# Patient Record
Sex: Male | Born: 1935 | Race: White | Hispanic: No | Marital: Married | Smoking: Former smoker
Health system: Southern US, Community
[De-identification: ages and names within clinical notes are randomized; demographics above are authoritative.]

## PROBLEM LIST (undated history)

## (undated) DIAGNOSIS — I4891 Unspecified atrial fibrillation: Secondary | ICD-10-CM

## (undated) DIAGNOSIS — D649 Anemia, unspecified: Secondary | ICD-10-CM

## (undated) DIAGNOSIS — R42 Dizziness and giddiness: Secondary | ICD-10-CM

## (undated) DIAGNOSIS — K7689 Other specified diseases of liver: Secondary | ICD-10-CM

## (undated) DIAGNOSIS — R634 Abnormal weight loss: Secondary | ICD-10-CM

## (undated) DIAGNOSIS — N400 Enlarged prostate without lower urinary tract symptoms: Secondary | ICD-10-CM

## (undated) DIAGNOSIS — I700 Atherosclerosis of aorta: Secondary | ICD-10-CM

## (undated) DIAGNOSIS — Z860100 Personal history of colon polyps, unspecified: Secondary | ICD-10-CM

## (undated) DIAGNOSIS — I351 Nonrheumatic aortic (valve) insufficiency: Secondary | ICD-10-CM

## (undated) DIAGNOSIS — I071 Rheumatic tricuspid insufficiency: Secondary | ICD-10-CM

## (undated) DIAGNOSIS — I340 Nonrheumatic mitral (valve) insufficiency: Secondary | ICD-10-CM

## (undated) DIAGNOSIS — N139 Obstructive and reflux uropathy, unspecified: Secondary | ICD-10-CM

## (undated) DIAGNOSIS — Z87442 Personal history of urinary calculi: Secondary | ICD-10-CM

## (undated) DIAGNOSIS — K8020 Calculus of gallbladder without cholecystitis without obstruction: Secondary | ICD-10-CM

## (undated) DIAGNOSIS — K4090 Unilateral inguinal hernia, without obstruction or gangrene, not specified as recurrent: Secondary | ICD-10-CM

## (undated) DIAGNOSIS — I517 Cardiomegaly: Secondary | ICD-10-CM

## (undated) DIAGNOSIS — R110 Nausea: Secondary | ICD-10-CM

## (undated) DIAGNOSIS — Z8719 Personal history of other diseases of the digestive system: Secondary | ICD-10-CM

## (undated) DIAGNOSIS — Z87438 Personal history of other diseases of male genital organs: Secondary | ICD-10-CM

## (undated) DIAGNOSIS — Z9889 Other specified postprocedural states: Secondary | ICD-10-CM

## (undated) DIAGNOSIS — Z9289 Personal history of other medical treatment: Secondary | ICD-10-CM

## (undated) DIAGNOSIS — IMO0002 Reserved for concepts with insufficient information to code with codable children: Secondary | ICD-10-CM

## (undated) HISTORY — DX: Personal history of colonic polyps: Z86.010

## (undated) HISTORY — DX: Obstructive and reflux uropathy, unspecified: N13.9

## (undated) HISTORY — DX: Personal history of other diseases of the digestive system: Z87.19

## (undated) HISTORY — PX: TESTICLE SURGERY: SHX794

## (undated) HISTORY — DX: Unspecified atrial fibrillation: I48.91

## (undated) HISTORY — DX: Personal history of colon polyps, unspecified: Z86.0100

---

## 1972-01-22 HISTORY — PX: HERNIA REPAIR: SHX51

## 2016-10-21 DIAGNOSIS — I071 Rheumatic tricuspid insufficiency: Secondary | ICD-10-CM

## 2016-10-21 DIAGNOSIS — I351 Nonrheumatic aortic (valve) insufficiency: Secondary | ICD-10-CM

## 2016-10-21 DIAGNOSIS — N400 Enlarged prostate without lower urinary tract symptoms: Secondary | ICD-10-CM

## 2016-10-21 DIAGNOSIS — I340 Nonrheumatic mitral (valve) insufficiency: Secondary | ICD-10-CM

## 2016-10-21 HISTORY — DX: Rheumatic tricuspid insufficiency: I07.1

## 2016-10-21 HISTORY — DX: Benign prostatic hyperplasia without lower urinary tract symptoms: N40.0

## 2016-10-21 HISTORY — DX: Nonrheumatic aortic (valve) insufficiency: I35.1

## 2016-10-21 HISTORY — DX: Nonrheumatic mitral (valve) insufficiency: I34.0

## 2016-11-04 ENCOUNTER — Inpatient Hospital Stay (HOSPITAL_COMMUNITY)
Admission: EM | Admit: 2016-11-04 | Discharge: 2016-11-07 | DRG: 684 | Disposition: A | Discharge status: Home / Self Care | Payer: Medicare Other | Attending: Family Medicine | Admitting: Family Medicine

## 2016-11-04 ENCOUNTER — Encounter (HOSPITAL_COMMUNITY): Payer: Self-pay

## 2016-11-04 DIAGNOSIS — R030 Elevated blood-pressure reading, without diagnosis of hypertension: Secondary | ICD-10-CM | POA: Diagnosis present

## 2016-11-04 DIAGNOSIS — N400 Enlarged prostate without lower urinary tract symptoms: Secondary | ICD-10-CM | POA: Diagnosis present

## 2016-11-04 DIAGNOSIS — R81 Glycosuria: Secondary | ICD-10-CM | POA: Diagnosis present

## 2016-11-04 DIAGNOSIS — N179 Acute kidney failure, unspecified: Principal | ICD-10-CM | POA: Diagnosis present

## 2016-11-04 DIAGNOSIS — R339 Retention of urine, unspecified: Secondary | ICD-10-CM | POA: Diagnosis not present

## 2016-11-04 DIAGNOSIS — N132 Hydronephrosis with renal and ureteral calculous obstruction: Secondary | ICD-10-CM | POA: Diagnosis not present

## 2016-11-04 DIAGNOSIS — Z8249 Family history of ischemic heart disease and other diseases of the circulatory system: Secondary | ICD-10-CM

## 2016-11-04 DIAGNOSIS — N320 Bladder-neck obstruction: Secondary | ICD-10-CM | POA: Diagnosis present

## 2016-11-04 DIAGNOSIS — I4891 Unspecified atrial fibrillation: Secondary | ICD-10-CM | POA: Diagnosis not present

## 2016-11-04 DIAGNOSIS — N3000 Acute cystitis without hematuria: Secondary | ICD-10-CM | POA: Diagnosis not present

## 2016-11-04 DIAGNOSIS — N202 Calculus of kidney with calculus of ureter: Secondary | ICD-10-CM | POA: Diagnosis not present

## 2016-11-04 DIAGNOSIS — N10 Acute tubulo-interstitial nephritis: Secondary | ICD-10-CM

## 2016-11-04 DIAGNOSIS — R338 Other retention of urine: Secondary | ICD-10-CM | POA: Diagnosis not present

## 2016-11-04 DIAGNOSIS — R739 Hyperglycemia, unspecified: Secondary | ICD-10-CM | POA: Diagnosis not present

## 2016-11-04 DIAGNOSIS — N136 Pyonephrosis: Secondary | ICD-10-CM | POA: Diagnosis present

## 2016-11-04 DIAGNOSIS — R748 Abnormal levels of other serum enzymes: Secondary | ICD-10-CM | POA: Diagnosis present

## 2016-11-04 DIAGNOSIS — R350 Frequency of micturition: Secondary | ICD-10-CM | POA: Diagnosis not present

## 2016-11-04 DIAGNOSIS — R319 Hematuria, unspecified: Secondary | ICD-10-CM | POA: Diagnosis not present

## 2016-11-04 DIAGNOSIS — R34 Anuria and oliguria: Secondary | ICD-10-CM | POA: Diagnosis not present

## 2016-11-04 DIAGNOSIS — N139 Obstructive and reflux uropathy, unspecified: Secondary | ICD-10-CM

## 2016-11-04 DIAGNOSIS — N390 Urinary tract infection, site not specified: Secondary | ICD-10-CM | POA: Diagnosis not present

## 2016-11-04 LAB — URINALYSIS, ROUTINE W REFLEX MICROSCOPIC
Bacteria, UA: NONE SEEN
Bilirubin Urine: NEGATIVE
Glucose, UA: 50 mg/dL — AB
KETONES UR: NEGATIVE mg/dL
LEUKOCYTES UA: NEGATIVE
Nitrite: NEGATIVE
Protein, ur: 30 mg/dL — AB
Specific Gravity, Urine: 1.014 (ref 1.005–1.030)
pH: 5 (ref 5.0–8.0)

## 2016-11-04 LAB — COMPREHENSIVE METABOLIC PANEL
ALBUMIN: 4 g/dL (ref 3.5–5.0)
ALK PHOS: 57 U/L (ref 38–126)
ALT: 14 U/L — AB (ref 17–63)
AST: 24 U/L (ref 15–41)
Anion gap: 13 (ref 5–15)
BILIRUBIN TOTAL: 1 mg/dL (ref 0.3–1.2)
BUN: 39 mg/dL — AB (ref 6–20)
CALCIUM: 9.1 mg/dL (ref 8.9–10.3)
CO2: 22 mmol/L (ref 22–32)
CREATININE: 2.75 mg/dL — AB (ref 0.61–1.24)
Chloride: 101 mmol/L (ref 101–111)
GFR calc Af Amer: 23 mL/min — ABNORMAL LOW (ref >60)
GFR calc non Af Amer: 20 mL/min — ABNORMAL LOW (ref >60)
GLUCOSE: 129 mg/dL — AB (ref 65–99)
Potassium: 4.3 mmol/L (ref 3.5–5.1)
Sodium: 136 mmol/L (ref 135–145)
TOTAL PROTEIN: 7.3 g/dL (ref 6.5–8.1)

## 2016-11-04 LAB — CBC
HCT: 47.1 % (ref 39.0–52.0)
Hemoglobin: 16 g/dL (ref 13.0–17.0)
MCH: 31.6 pg (ref 26.0–34.0)
MCHC: 34 g/dL (ref 30.0–36.0)
MCV: 92.9 fL (ref 78.0–100.0)
Platelets: 230 10*3/uL (ref 150–400)
RBC: 5.07 MIL/uL (ref 4.22–5.81)
RDW: 13.5 % (ref 11.5–15.5)
WBC: 18.5 10*3/uL — ABNORMAL HIGH (ref 4.0–10.5)

## 2016-11-04 LAB — LIPASE, BLOOD: Lipase: 80 U/L — ABNORMAL HIGH (ref 11–51)

## 2016-11-04 MED ORDER — DEXTROSE 5 % IV SOLN
1.0000 g | Freq: Once | INTRAVENOUS | Status: AC
  Administered 2016-11-04: 1 g via INTRAVENOUS
  Filled 2016-11-04: qty 10

## 2016-11-04 NOTE — ED Provider Notes (Signed)
[facility] EMERGENCY DEPARTMENT Provider Note   CSN: [id number] Arrival date & time: 11/04/16  1630     History   Chief Complaint Chief Complaint  Patient presents with  . Hematuria    HPI Johnny Patel is a 83 y.o. male.  HPI Patient with no previous medical history. Presents with difficulty urinating for the past 4 days. Has had bilateral flank pain intermittently. Denies nausea or vomiting. Denies fever or chills. Takes Tylenol occasionally but takes no other medication. States he has not seen a doctor for the past 43 years. History reviewed. No pertinent past medical history.  Patient Active Problem List   Diagnosis Date Noted  . ARF (acute renal failure) ([place]) 11/04/2016    Past Surgical History:  Procedure Laterality Date  . [place] Medications    Prior to Admission medications   Not on File    Family History No family history on file.  Social History Social History  Substance Use Topics  . Smoking status: Never Smoker  . Smokeless tobacco: Never Used  . Alcohol use Yes     Allergies   Patient has no allergy information on record.   Review of Systems Review of Systems  Constitutional: Negative for chills and fever.  Respiratory: Negative for shortness of breath.   Cardiovascular: Negative for chest pain.  Gastrointestinal: Positive for abdominal distention and abdominal pain. Negative for nausea and vomiting.  Genitourinary: Positive for difficulty urinating and flank pain. Negative for dysuria, frequency and hematuria.  Musculoskeletal: Positive for back pain. Negative for neck pain and neck stiffness.  Skin: Negative for rash and wound.  Neurological: Negative for dizziness, weakness, numbness and headaches.  All other systems reviewed and are negative.    Physical Exam Updated Vital Signs BP (!) 149/109 (BP Location: Left Arm)   Pulse (!) 106   Temp 98.3 F (36.8 C) (Oral)   Resp 18   Ht 5\' 11"  (1.803 m)    Wt 90.3 kg (199 lb)   SpO2 100%   BMI 27.75 kg/m   Physical Exam  Constitutional: He is oriented to person, place, and time. He appears well-developed and well-nourished.  HENT:  Head: Normocephalic and atraumatic.  Mouth/Throat: Oropharynx is clear and moist.  Eyes: Pupils are equal, round, and reactive to light. EOM are normal.  Neck: Normal range of motion. Neck supple.  Cardiovascular: Normal rate and regular rhythm.   Pulmonary/Chest: Effort normal and breath sounds normal. No respiratory distress. He has no wheezes. He has no rales. He exhibits no tenderness.  Abdominal: Soft. Bowel sounds are normal. He exhibits mass. There is tenderness. There is no rebound and no guarding.  Firm past, tender mass in the supraumbilical region.  Genitourinary: Penis normal.  Musculoskeletal: Normal range of motion. He exhibits no edema or tenderness.  No CVA tenderness.  Neurological: He is alert and oriented to person, place, and time.  Skin: Skin is warm and dry. No rash noted. No erythema.  Psychiatric: He has a normal mood and affect. His behavior is normal.  Nursing note and vitals reviewed.    ED Treatments / Results  Labs (all labs ordered are listed, but only abnormal results are displayed) Labs Reviewed  URINALYSIS, ROUTINE W REFLEX MICROSCOPIC - Abnormal; Notable for the following:       Result Value   APPearance HAZY (*)    Glucose, UA 50 (*)    Hgb urine dipstick LARGE (*)  Protein, ur 30 (*)    Squamous Epithelial / LPF 0-5 (*)    All other components within normal limits  LIPASE, BLOOD - Abnormal; Notable for the following:    Lipase 80 (*)    All other components within normal limits  COMPREHENSIVE METABOLIC PANEL - Abnormal; Notable for the following:    Glucose, Bld 129 (*)    BUN 39 (*)    Creatinine, Ser 2.75 (*)    ALT 14 (*)    GFR calc non Af Amer 20 (*)    GFR calc Af Amer 23 (*)    All other components within normal limits  CBC - Abnormal; Notable  for the following:    WBC 18.5 (*)    All other components within normal limits  URINE CULTURE    EKG  EKG Interpretation None       Radiology No results found.  Procedures Procedures (including critical care time)  Medications Ordered in ED Medications  cefTRIAXone (ROCEPHIN) 1 g in dextrose 5 % 50 mL IVPB (1 g Intravenous New Bag/Given 11/04/16 2248)     Initial Impression / Assessment and Plan / ED Course  I have reviewed the triage vital signs and the nursing notes.  Pertinent labs & imaging results that were available during my care of the patient were reviewed by me and considered in my medical decision making (see chart for details).     Bladder scan with greater than 700 mL. Full catheter place. Abdominal mass has diminished in size. Patient says he is feeling much better. Labs with elevation in white blood cell count, elevation in creatinine and urine with too numerous to count red blood cells and white blood cells. Urine was sent for culture. Will treat for likely UTI. Discussed with Dr. Maudie Mercury. Will admit.  Final Clinical Impressions(s) / ED Diagnoses   Final diagnoses:  Urinary retention  AKI (acute kidney injury) [facility])    New Prescriptions New Prescriptions   No medications on file     Julianne Rice, MD 11/04/16 2253

## 2016-11-04 NOTE — ED Triage Notes (Signed)
Pt went to urgent care this morning and was checked for a UTI. Pt did not ever notice blood in his urine, but per Urgent Care paperwork, states there is a large amount of blood in urine. Pt is not lightheaded or dizzy. Pt states he is not able to pee easily unless he forces it out.

## 2016-11-05 ENCOUNTER — Inpatient Hospital Stay (HOSPITAL_COMMUNITY): Payer: Medicare Other

## 2016-11-05 ENCOUNTER — Encounter (HOSPITAL_COMMUNITY): Payer: Self-pay | Admitting: Internal Medicine

## 2016-11-05 DIAGNOSIS — R339 Retention of urine, unspecified: Secondary | ICD-10-CM | POA: Insufficient documentation

## 2016-11-05 DIAGNOSIS — R739 Hyperglycemia, unspecified: Secondary | ICD-10-CM

## 2016-11-05 DIAGNOSIS — I4891 Unspecified atrial fibrillation: Secondary | ICD-10-CM

## 2016-11-05 DIAGNOSIS — N179 Acute kidney failure, unspecified: Secondary | ICD-10-CM

## 2016-11-05 DIAGNOSIS — N3000 Acute cystitis without hematuria: Secondary | ICD-10-CM

## 2016-11-05 DIAGNOSIS — N139 Obstructive and reflux uropathy, unspecified: Secondary | ICD-10-CM

## 2016-11-05 DIAGNOSIS — N10 Acute tubulo-interstitial nephritis: Secondary | ICD-10-CM

## 2016-11-05 DIAGNOSIS — N390 Urinary tract infection, site not specified: Secondary | ICD-10-CM

## 2016-11-05 LAB — COMPREHENSIVE METABOLIC PANEL
ALBUMIN: 3.2 g/dL — AB (ref 3.5–5.0)
ALK PHOS: 44 U/L (ref 38–126)
ALT: 10 U/L — ABNORMAL LOW (ref 17–63)
ANION GAP: 11 (ref 5–15)
AST: 18 U/L (ref 15–41)
BUN: 38 mg/dL — ABNORMAL HIGH (ref 6–20)
CALCIUM: 8.5 mg/dL — AB (ref 8.9–10.3)
CO2: 22 mmol/L (ref 22–32)
Chloride: 105 mmol/L (ref 101–111)
Creatinine, Ser: 1.75 mg/dL — ABNORMAL HIGH (ref 0.61–1.24)
GFR calc non Af Amer: 34 mL/min — ABNORMAL LOW (ref >60)
GFR, EST AFRICAN AMERICAN: 40 mL/min — AB (ref >60)
GLUCOSE: 105 mg/dL — AB (ref 65–99)
POTASSIUM: 3.8 mmol/L (ref 3.5–5.1)
Sodium: 138 mmol/L (ref 135–145)
TOTAL PROTEIN: 6 g/dL — AB (ref 6.5–8.1)
Total Bilirubin: 0.9 mg/dL (ref 0.3–1.2)

## 2016-11-05 LAB — CBC
HEMATOCRIT: 42 % (ref 39.0–52.0)
HEMOGLOBIN: 14.3 g/dL (ref 13.0–17.0)
MCH: 31.5 pg (ref 26.0–34.0)
MCHC: 34 g/dL (ref 30.0–36.0)
MCV: 92.5 fL (ref 78.0–100.0)
Platelets: 203 10*3/uL (ref 150–400)
RBC: 4.54 MIL/uL (ref 4.22–5.81)
RDW: 13.5 % (ref 11.5–15.5)
WBC: 14.4 10*3/uL — ABNORMAL HIGH (ref 4.0–10.5)

## 2016-11-05 LAB — TSH: TSH: 0.588 u[IU]/mL (ref 0.350–4.500)

## 2016-11-05 LAB — HEMOGLOBIN A1C
Hgb A1c MFr Bld: 5.5 % (ref 4.8–5.6)
MEAN PLASMA GLUCOSE: 111.15 mg/dL

## 2016-11-05 LAB — T4, FREE: FREE T4: 1.09 ng/dL (ref 0.61–1.12)

## 2016-11-05 LAB — SODIUM, URINE, RANDOM: SODIUM UR: 38 mmol/L

## 2016-11-05 MED ORDER — ACETAMINOPHEN 650 MG RE SUPP: 650.0000 mg | Freq: Four times a day (QID) | RECTAL | Status: DC | PRN

## 2016-11-05 MED ORDER — ACETAMINOPHEN 325 MG PO TABS: 650.0000 mg | ORAL_TABLET | Freq: Four times a day (QID) | ORAL | Status: DC | PRN

## 2016-11-05 MED ORDER — SODIUM CHLORIDE 0.9 % IV SOLN
INTRAVENOUS | Status: AC
  Administered 2016-11-05 (×2): via INTRAVENOUS
  Administered 2016-11-05: 1000 mL via INTRAVENOUS

## 2016-11-05 MED ORDER — DEXTROSE 5 % IV SOLN
1.0000 g | INTRAVENOUS | Status: DC
  Administered 2016-11-06 – 2016-11-07 (×2): 1 g via INTRAVENOUS
  Filled 2016-11-05 (×3): qty 10

## 2016-11-05 MED ORDER — TAMSULOSIN HCL 0.4 MG PO CAPS
0.4000 mg | ORAL_CAPSULE | Freq: Every day | ORAL | Status: DC
  Administered 2016-11-05 – 2016-11-07 (×3): 0.4 mg via ORAL
  Filled 2016-11-05 (×3): qty 1

## 2016-11-05 NOTE — Progress Notes (Signed)
PROGRESS NOTE  Johnny Patel XKG:[id number] DOB: 11/10/1931 DOA: 11/04/2016 PCP: Patient, No Pcp Per  Brief History:  83 year old male with no documented chronic medical problems presented with dysuria and difficulty urinating since 11/01/2016.  The patient states that he has not seen a physician in 43 years; as a result, the patient takes no prescription medications. He states that he has had difficulty starting his urinary stream for some time. Since 11/01/2016, he has had to push on his abdomen to increase intra-abdominal pressure in order to urinate. He also noted some hematuria. Because of increasing left flank pain and suprapubic pain, he presented to emergency department for further evaluation. He denied any fevers, chills, chest pain, sob, nausea, vomiting.  In the emergency department, bladder scan revealed > 700 mL of urine. Foley catheter was placed releasing 1 L of urine. Subsequently, his abdominal pain and flank pain improved. Urinalysis showed TNTC WBC. The patient was started on IV fluids and intravenous ceftriaxone.  Assessment/Plan: AKI -Baseline renal function unknown -Presenting creatinine 2.75 -Renal ultrasound -Likely secondary to obstructive uropathy -A.m. BMP -keep Foley for today, voiding trial starting 11/06/2016 -Continue tamulosin -continue IVF  Atrial fibrillation, unspecified type -The patient has atrial fibrillation on telemetry with IRRR on examination -Obtain EKG -TSH -Echocardiogram  Pyelonephritis -pt presented with left flank pain and leukocytosis -continue ceftriaxone pending culture data  Elevated lipase -Likely secondary to acute kidney injury -No clinical evidence for pancreatitis  Hyperglycemia with glycosuria -Hemoglobin A1c  Elevated Blood Pressure  -no hx of HTN -may be due to acute medical situation -monitor BP for now    Disposition Plan:   Home in 1-2 days  Family Communication:  Family updated at bedside  10/16--Total time spent 31 minutes.  Greater than 50% spent face to face counseling and coordinating care. 0955 to 3   Consultants:  none  Code Status:  FULL  DVT Prophylaxis:  SCDs   Procedures: As Listed in Progress Note Above  Antibiotics: Ceftriaxone 10/15>>>    Subjective: Patient denies fevers, chills, headache, chest pain, dyspnea, nausea, vomiting, diarrhea, abdominal pain, dysuria, hematuria, hematochezia, and melena.   Objective: Vitals:   11/04/16 2330 11/05/16 0000 11/05/16 0602 11/05/16 0850  BP: (!) 142/98 (!) 143/98 (!) 140/91 128/89  Pulse: (!) 102 99 99 85  Resp: 18 17 14 18   Temp:   98.5 F (36.9 C)   TempSrc:   Oral   SpO2: 94% 94% 96% 98%  Weight:      Height:        Intake/Output Summary (Last 24 hours) at 11/05/16 1014 Last data filed at 11/04/16 2323  Gross per 24 hour  Intake               50 ml  Output             1100 ml  Net            -1050 ml   Weight change:  Exam:   General:  Pt is alert, follows commands appropriately, not in acute distress  HEENT: No icterus, No thrush, No neck mass, [place]/AT  Cardiovascular: RRR, S1/S2, no rubs, no gallops  Respiratory: CTA bilaterally, no wheezing, no crackles, no rhonchi  Abdomen: Soft/+BS, non tender, non distended, no guarding  Extremities: No edema, No lymphangitis, No petechiae, No rashes, no synovitis   Data Reviewed: I have personally reviewed following labs and imaging studies Basic Metabolic Panel:  Recent Labs  Lab 11/04/16 1917 11/05/16 0500  NA 136 138  K 4.3 3.8  CL 101 105  CO2 22 22  GLUCOSE 129* 105*  BUN 39* 38*  CREATININE 2.75* 1.75*  CALCIUM 9.1 8.5*   Liver Function Tests:  Recent Labs Lab 11/04/16 1917 11/05/16 0500  AST 24 18  ALT 14* 10*  ALKPHOS 57 44  BILITOT 1.0 0.9  PROT 7.3 6.0*  ALBUMIN 4.0 3.2*    Recent Labs Lab 11/04/16 1917  LIPASE 80*   No results for input(s): AMMONIA in the last 168 hours. Coagulation Profile: No  results for input(s): INR, PROTIME in the last 168 hours. CBC:  Recent Labs Lab 11/04/16 1917 11/05/16 0500  WBC 18.5* 14.4*  HGB 16.0 14.3  HCT 47.1 42.0  MCV 92.9 92.5  PLT 230 203   Cardiac Enzymes: No results for input(s): CKTOTAL, CKMB, CKMBINDEX, TROPONINI in the last 168 hours. BNP: Invalid input(s): POCBNP CBG: No results for input(s): GLUCAP in the last 168 hours. HbA1C: No results for input(s): HGBA1C in the last 72 hours. Urine analysis:    Component Value Date/Time   COLORURINE YELLOW 11/04/2016 1720   APPEARANCEUR HAZY (A) 11/04/2016 1720   LABSPEC 1.014 11/04/2016 1720   PHURINE 5.0 11/04/2016 1720   GLUCOSEU 50 (A) 11/04/2016 1720   HGBUR LARGE (A) 11/04/2016 1720   BILIRUBINUR NEGATIVE 11/04/2016 1720   KETONESUR NEGATIVE 11/04/2016 1720   PROTEINUR 30 (A) 11/04/2016 1720   NITRITE NEGATIVE 11/04/2016 1720   LEUKOCYTESUR NEGATIVE 11/04/2016 1720   Sepsis Labs: @LABRCNTIP (procalcitonin:4,lacticidven:4) )No results found for this or any previous visit (from the past 240 hour(s)).   Scheduled Meds: . tamsulosin  0.4 mg Oral QPC supper   Continuous Infusions: . sodium chloride 1,000 mL (11/05/16 0136)  . cefTRIAXone (ROCEPHIN)  IV      Procedures/Studies: US Renal  Result Date: 11/05/2016 CLINICAL DATA:  Acute renal failure EXAM: RENAL / URINARY TRACT ULTRASOUND COMPLETE COMPARISON:  None. FINDINGS: Right Kidney: Length: 12.0 cm. Moderate hydronephrosis is noted. A 2.1 cm cyst is noted centrally adjacent to the renal pelvis. Left Kidney: Length: 11.4 cm. Echogenicity within normal limits. No mass or hydronephrosis visualized. Bladder: Decompressed by Foley catheter. IMPRESSION: Moderate hydronephrosis on the right. Right renal cyst. Electronically Signed   By: Inez Catalina M.D.   On: 11/05/2016 10:07    Ariela Mochizuki, DO  [facility] Pager [phone]  If 7PM-7AM, please contact night-coverage [email or web address] Password [email or web address] 11/05/2016, 10:14  AM   LOS: 1 day

## 2016-11-05 NOTE — H&P (Signed)
[facility] H&P   Patient Demographics:    Johnny Patel, is a 83 y.o. male  MRN: [id number]   DOB - 01/01/1932  Admit Date - 11/04/2016  Outpatient Primary MD for the patient is Patient, No Pcp Per  Referring MD/NP/PA: Julianne Rice  Outpatient Specialists:   Patient coming from: home  Chief Complaint  Patient presents with  . Hematuria      HPI:    Johnny Patel  is a 83 y.o. male, w c/o inability to urinate starting on Friday nite.  Pt was unable to urinate very much and therefore went to urgent care in [place], [place] and was told to go to ED for evaluation.   In ED  Bun 39, Creatinine 2.75,  Glucose 129 Wbc 18.5, Hgb 16.0, Plt 230 Lipase 80 ua wbc tntc, rbc tntc   Pt will be admitted for ARF, and UTI, bladder outlet obstruction.     Review of systems:    In addition to the HPI above, No Fever-chills, No Headache, No changes with Vision or hearing, No problems swallowing food or Liquids, No Chest pain, Cough or Shortness of Breath, No Abdominal pain, No Nausea or Vommitting, Bowel movements are regular, No Blood in stool   No new skin rashes or bruises, No new joints pains-aches,  No new weakness, tingling, numbness in any extremity, No recent weight gain or loss, No polyuria, polydypsia or polyphagia, No significant Mental Stressors.  A full 10 point Review of Systems was done, except as stated above, all other Review of Systems were negative.   With Past History of the following :    History reviewed. No pertinent past medical history.    Past Surgical History:  Procedure Laterality Date  . HERNIA REPAIR  1974      Social History:     Social History  Substance Use Topics  . Smoking status: Never Smoker  . Smokeless tobacco: Never Used  . Alcohol use Yes     Lives - at home  Mobility - walks by self   Family History :     Family  History  Problem Relation Age of Onset  . Dementia Mother   . Lung cancer Father        smoker  . CAD Father        MI at age 33      Home Medications:   Prior to Admission medications   Not on File     Allergies:    Not on File   Physical Exam:   Vitals  Blood pressure (!) 143/98, pulse 99, temperature 98.3 F (36.8 C), temperature source Oral, resp. rate 17, height 5\' 11"  (1.803 m), weight 90.3 kg (199 lb), SpO2 94 %.   1. General  lying in bed in NAD,    2. Normal affect and insight, Not Suicidal or Homicidal, Awake Alert, Oriented X 3.  3. No F.N deficits, ALL C.Nerves Intact, Strength 5/5 all 4 extremities, Sensation intact all 4 extremities, Plantars down going.  4. Ears and Eyes appear Normal, Conjunctivae clear, PERRLA. Moist Oral Mucosa.  5. Supple Neck, No JVD, No cervical lymphadenopathy appriciated, No Carotid Bruits.  6. Symmetrical Chest wall movement, Good air movement bilaterally, CTAB.  7. RRR, No Gallops, Rubs or Murmurs, No Parasternal Heave.  8. Positive Bowel Sounds, Abdomen Soft, No tenderness, No organomegaly appriciated,No rebound -guarding or rigidity.  9.  No Cyanosis, Normal Skin Turgor, No Skin Rash or Bruise.  10. Good muscle tone,  joints appear normal , no effusions, Normal ROM.  11. No Palpable Lymph Nodes in Neck or Axillae  No cva tenderness, Foley catheter in place    Data Review:    CBC  Recent Labs Lab 11/04/16 1917  WBC 18.5*  HGB 16.0  HCT 47.1  PLT 230  MCV 92.9  MCH 31.6  MCHC 34.0  RDW 13.5   ------------------------------------------------------------------------------------------------------------------  Chemistries   Recent Labs Lab 11/04/16 1917  NA 136  K 4.3  CL 101  CO2 22  GLUCOSE 129*  BUN 39*  CREATININE 2.75*  CALCIUM 9.1  AST 24  ALT 14*  ALKPHOS 57  BILITOT 1.0    ------------------------------------------------------------------------------------------------------------------ estimated creatinine clearance is 21.7 mL/min (A) (by C-G formula based on SCr of 2.75 mg/dL (H)). ------------------------------------------------------------------------------------------------------------------ No results for input(s): TSH, T4TOTAL, T3FREE, THYROIDAB in the last 72 hours.  Invalid input(s): FREET3  Coagulation profile No results for input(s): INR, PROTIME in the last 168 hours. ------------------------------------------------------------------------------------------------------------------- No results for input(s): DDIMER in the last 72 hours. -------------------------------------------------------------------------------------------------------------------  Cardiac Enzymes No results for input(s): CKMB, TROPONINI, MYOGLOBIN in the last 168 hours.  Invalid input(s): CK ------------------------------------------------------------------------------------------------------------------ No results found for: BNP   ---------------------------------------------------------------------------------------------------------------  Urinalysis    Component Value Date/Time   COLORURINE YELLOW 11/04/2016 1720   APPEARANCEUR HAZY (A) 11/04/2016 1720   LABSPEC 1.014 11/04/2016 1720   PHURINE 5.0 11/04/2016 1720   GLUCOSEU 50 (A) 11/04/2016 1720   HGBUR LARGE (A) 11/04/2016 1720   BILIRUBINUR NEGATIVE 11/04/2016 [place] 11/04/2016 1720   PROTEINUR 30 (A) 11/04/2016 1720   NITRITE NEGATIVE 11/04/2016 1720   LEUKOCYTESUR NEGATIVE 11/04/2016 1720    ----------------------------------------------------------------------------------------------------------------   Imaging Results:    No results found.    Assessment & Plan:    Principal Problem:   ARF (acute renal failure) ([facility]) Active Problems:   UTI (urinary tract infection)    Hyperglycemia    ARF likely secondary to bph/obstruction Check urine sodium, urine creatinine urine eosinophils Check renal ultrasound Place Foley Start flomax 0.4mg  po qhs Hydrate with ns Check cmp in am  UTI Await urine culture Start Rocephin 1gm iv qday  Hyperglycemia Check hga1c,   Leukocytosis Secondary to UTI Check cbc in am  DVT Prophylaxis   Lovenox - SCDs   AM Labs Ordered, also please review Full Orders  Family Communication: Admission, patients condition and plan of care including tests being ordered have been discussed with the patient  who indicate understanding and agree with the plan and Code Status.  Code Status FULL CODE  Likely DC to  home  Condition GUARDED    Consults called: urology consult placed in computer  Admission status: inpatient  Time spent in minutes : 45   Jani Gravel M.D on 11/05/2016 at 12:18 AM  Between 7am to 7pm - Pager - [phone]. After 7pm go to [email or web address] - password [facility]  [facility] - Office  [phone]

## 2016-11-05 NOTE — Progress Notes (Signed)
Renal US with right hydronephrosis --consulted urology --CT abd/pelvis without contrast   EKG--Afib with nonspecific T wave changes -will not start [facility] or ASA due to hematuria and pending urology evaluation -Echo pending  DTat

## 2016-11-06 ENCOUNTER — Inpatient Hospital Stay (HOSPITAL_COMMUNITY): Payer: Medicare Other

## 2016-11-06 DIAGNOSIS — I517 Cardiomegaly: Secondary | ICD-10-CM

## 2016-11-06 DIAGNOSIS — N179 Acute kidney failure, unspecified: Secondary | ICD-10-CM | POA: Diagnosis not present

## 2016-11-06 DIAGNOSIS — I4891 Unspecified atrial fibrillation: Secondary | ICD-10-CM

## 2016-11-06 DIAGNOSIS — R350 Frequency of micturition: Secondary | ICD-10-CM

## 2016-11-06 DIAGNOSIS — N202 Calculus of kidney with calculus of ureter: Secondary | ICD-10-CM

## 2016-11-06 DIAGNOSIS — R338 Other retention of urine: Secondary | ICD-10-CM

## 2016-11-06 HISTORY — DX: Cardiomegaly: I51.7

## 2016-11-06 LAB — CBC
HEMATOCRIT: 37.3 % — AB (ref 39.0–52.0)
HEMOGLOBIN: 12.5 g/dL — AB (ref 13.0–17.0)
MCH: 31.3 pg (ref 26.0–34.0)
MCHC: 33.5 g/dL (ref 30.0–36.0)
MCV: 93.3 fL (ref 78.0–100.0)
Platelets: 203 10*3/uL (ref 150–400)
RBC: 4 MIL/uL — AB (ref 4.22–5.81)
RDW: 13.5 % (ref 11.5–15.5)
WBC: 10.4 10*3/uL (ref 4.0–10.5)

## 2016-11-06 LAB — URINE CULTURE: CULTURE: NO GROWTH

## 2016-11-06 LAB — CALCIUM / CREATININE RATIO, URINE
CALCIUM UR: 1.3 mg/dL
CREATININE, UR: 103.3 mg/dL
Calcium/Creat.Ratio: 13 mg/g creat (ref 0–260)

## 2016-11-06 LAB — BASIC METABOLIC PANEL
Anion gap: 8 (ref 5–15)
BUN: 27 mg/dL — ABNORMAL HIGH (ref 6–20)
CHLORIDE: 106 mmol/L (ref 101–111)
CO2: 23 mmol/L (ref 22–32)
Calcium: 7.9 mg/dL — ABNORMAL LOW (ref 8.9–10.3)
Creatinine, Ser: 1.02 mg/dL (ref 0.61–1.24)
GFR calc Af Amer: >60 mL/min (ref >60)
GLUCOSE: 109 mg/dL — AB (ref 65–99)
Potassium: 3.4 mmol/L — ABNORMAL LOW (ref 3.5–5.1)
Sodium: 137 mmol/L (ref 135–145)

## 2016-11-06 LAB — ECHOCARDIOGRAM COMPLETE
HEIGHTINCHES: 71 in
WEIGHTICAEL: 3092.8 [oz_av]

## 2016-11-06 MED ORDER — ENOXAPARIN SODIUM 100 MG/ML ~~LOC~~ SOLN
90.0000 mg | Freq: Two times a day (BID) | SUBCUTANEOUS | Status: DC
  Administered 2016-11-06 – 2016-11-07 (×2): 90 mg via SUBCUTANEOUS
  Filled 2016-11-06 (×2): qty 1

## 2016-11-06 NOTE — Progress Notes (Signed)
PROGRESS NOTE    Johnny Patel  CZY:[id number] DOB: Jun 03, 1931 DOA: 11/04/2016 PCP: Patient, No Pcp Per    Brief Narrative:  Johnny Patel  is a 83 y.o. male, w c/o inability to urinate starting on Friday nite.  Pt was unable to urinate very much and therefore went to urgent care in [place], [place] and was told to go to ED for evaluation.   Bun 39, Creatinine 2.75,  Glucose 129 Wbc 18.5, Hgb 16.0, Plt 230 Lipase 80 ua wbc tntc, rbc tntc   Pt will be admitted for ARF, and UTI, bladder outlet obstruction.    Assessment & Plan:   Principal Problem:   ARF (acute renal failure) (HCC) Active Problems:   UTI (urinary tract infection)   Hyperglycemia   AKI (acute kidney injury) ([place])   Obstructive uropathy   Acute pyelonephritis   Unspecified atrial fibrillation ([place])   ARF likely secondary to bph/obstruction - renal ultrasound showing hydronephrosis - urology consulted  - can discontinue foley - continue flomax  Atrial fibrillation - ? Of chronicity - CHADS- Vasc score of 2 (age) - discussed anticoagulation today - patient seems to be leaning toward aspirin - start tx dose lovenox  UTI - Urine culture negative - await recommendations from urology (if no recommendations of antibiotics d/c)  Hyperglycemia - hga1c 5.5  Leukocytosis - WBC 10.4  DVT Prophylaxis   Lovenox - SCDs  Family Communication: Admission, patients condition and plan of care including tests being ordered have been discussed with the patient  who indicate understanding and agree with the plan and Code Status. Code Status FULL CODE Likely DC to  home  Consults called: urology consult     Antimicrobials:   Rocephin     Subjective: Patient sitting up in bed joking with wife and family friend.  Discussed need for long term anticoagulation.  He denies any abdominal pain, flank pain, chest pain.  Reports that he never had issues urinating.  Objective: Vitals:   11/05/16 1138 11/05/16  2205 11/06/16 0500 11/06/16 0539  BP: (!) 157/100 (!) 142/78  118/76  Pulse: 86 84  (!) 103  Resp: 18 18  16   Temp: (!) 97.5 F (36.4 C) 97.8 F (36.6 C)  98.2 F (36.8 C)  TempSrc:  Oral  Oral  SpO2: 98% 98%  95%  Weight:   87.7 kg (193 lb 4.8 oz)   Height: 5\' 11"  (1.803 m)       Intake/Output Summary (Last 24 hours) at 11/06/16 1642 Last data filed at 11/06/16 0535  Gross per 24 hour  Intake              460 ml  Output             1100 ml  Net             -640 ml   Filed Weights   11/04/16 1717 11/06/16 0500  Weight: 90.3 kg (199 lb) 87.7 kg (193 lb 4.8 oz)    Examination:  General exam: Appears calm and comfortable  Respiratory system: Clear to auscultation. Respiratory effort normal. Cardiovascular system: S1 & S2 heard, IRRR. No JVD, murmurs, rubs, gallops or clicks. No pedal edema. Gastrointestinal system: Abdomen is nondistended, soft and nontender. No organomegaly or masses felt. Normal bowel sounds heard. Foley catheter in place Central nervous system: Alert and oriented. No focal neurological deficits. Extremities: Symmetric 5 x 5 power. Skin: No rashes, lesions or ulcers Psychiatry: Judgement and insight appear normal. Mood &  affect appropriate.     Data Reviewed: I have personally reviewed following labs and imaging studies  CBC:  Recent Labs Lab 11/04/16 1917 11/05/16 0500 11/06/16 0613  WBC 18.5* 14.4* 10.4  HGB 16.0 14.3 12.5*  HCT 47.1 42.0 37.3*  MCV 92.9 92.5 93.3  PLT 230 203 944   Basic Metabolic Panel:  Recent Labs Lab 11/04/16 1917 11/05/16 0500 11/06/16 0613  NA 136 138 137  K 4.3 3.8 3.4*  CL 101 105 106  CO2 22 22 23   GLUCOSE 129* 105* 109*  BUN 39* 38* 27*  CREATININE 2.75* 1.75* 1.02  CALCIUM 9.1 8.5* 7.9*   GFR: Estimated Creatinine Clearance: 58.4 mL/min (by C-G formula based on SCr of 1.02 mg/dL). Liver Function Tests:  Recent Labs Lab 11/04/16 1917 11/05/16 0500  AST 24 18  ALT 14* 10*  ALKPHOS 57 44    BILITOT 1.0 0.9  PROT 7.3 6.0*  ALBUMIN 4.0 3.2*    Recent Labs Lab 11/04/16 1917  LIPASE 80*   No results for input(s): AMMONIA in the last 168 hours. Coagulation Profile: No results for input(s): INR, PROTIME in the last 168 hours. Cardiac Enzymes: No results for input(s): CKTOTAL, CKMB, CKMBINDEX, TROPONINI in the last 168 hours. BNP (last 3 results) No results for input(s): PROBNP in the last 8760 hours. HbA1C:  Recent Labs  11/05/16 1100  HGBA1C 5.5   CBG: No results for input(s): GLUCAP in the last 168 hours. Lipid Profile: No results for input(s): CHOL, HDL, LDLCALC, TRIG, CHOLHDL, LDLDIRECT in the last 72 hours. Thyroid Function Tests:  Recent Labs  11/05/16 1100  TSH 0.588  FREET4 1.09   Anemia Panel: No results for input(s): VITAMINB12, FOLATE, FERRITIN, TIBC, IRON, RETICCTPCT in the last 72 hours. Sepsis Labs: No results for input(s): PROCALCITON, LATICACIDVEN in the last 168 hours.  Recent Results (from the past 240 hour(s))  Urine culture     Status: None   Collection Time: 11/04/16 10:40 PM  Result Value Ref Range Status   Specimen Description URINE, RANDOM  Final   Special Requests NONE  Final   Culture   Final    NO GROWTH Performed at [facility], [address], [place], [place] [address]    Report Status 11/06/2016 FINAL  Final         Radiology Studies: Ct Abdomen Pelvis Wo Contrast  Result Date: 11/05/2016 CLINICAL DATA:  Hydronephrosis.  Dysuria. EXAM: CT ABDOMEN AND PELVIS WITHOUT CONTRAST TECHNIQUE: Multidetector CT imaging of the abdomen and pelvis was performed following the standard protocol without IV contrast. COMPARISON:  None. FINDINGS: Lower chest: Scar versus subsegmental atelectasis identified in the lung bases. Hepatobiliary: Cysts identified within the liver. No suspicious liver abnormalities. Stone within the neck of gallbladder measures 2.3 cm, image 33 of series 2. Pancreas: Unremarkable. No pancreatic  ductal dilatation or surrounding inflammatory changes. Spleen: Normal in size without focal abnormality. Adrenals/Urinary Tract: The adrenal glands appear normal. Stones within the inferior pole of the right kidney noted measuring up to 5 mm. There is right-sided hydronephrosis and hydroureter. Large stone within the distal right ureter measures 0.8 x 1.0 cm, image 79 of series 2. Bilateral kidney lesions of varying density are incompletely characterized without IV contrast. No left renal calculi or hydronephrosis identified. The urinary bladder is collapsed around a Foley catheter balloon. Gas noted within the bladder is likely related to instrumentation. Mild bladder wall thickening is identified with surrounding fat stranding. Stomach/Bowel: The stomach is normal. There  is wall thickening and fat stranding associated with the second and third portions of the duodenum. Suspicious for duodenitis. No abnormal bowel dilatation identified. Unremarkable appearance of the colon. Vascular/Lymphatic: Aortic atherosclerosis. No aneurysm. No adenopathy within the abdomen or pelvis. No inguinal adenopathy. Reproductive: The prostate gland measures 6.9 x 7.3 x 10.5 cm (volume = 280 cm^3) Other: No free fluid or fluid collections identified. Musculoskeletal: Mild degenerative disc disease within the lumbar spine. No aggressive lytic or sclerotic bone lesions. IMPRESSION: 1. Right-sided hydronephrosis and hydroureter is identified secondary to 8 x 10 mm distal ureteral calculus. 2. Marked prostate gland enlargement. Bladder wall thickening may be the sequelae of chronic bladder outlet obstruction. There is fat stranding surrounding the bladder wall may reflect underlying cystitis. 3. Wall thickening and fat stranding involving the second and third portions of the duodenum. Cannot rule out duodenitis 4.  Aortic Atherosclerosis (ICD10-I70.0). 5. Gallstone. Electronically Signed   By: Kerby Moors M.D.   On: 11/05/2016 20:34    US Renal  Result Date: 11/05/2016 CLINICAL DATA:  Acute renal failure EXAM: RENAL / URINARY TRACT ULTRASOUND COMPLETE COMPARISON:  None. FINDINGS: Right Kidney: Length: 12.0 cm. Moderate hydronephrosis is noted. A 2.1 cm cyst is noted centrally adjacent to the renal pelvis. Left Kidney: Length: 11.4 cm. Echogenicity within normal limits. No mass or hydronephrosis visualized. Bladder: Decompressed by Foley catheter. IMPRESSION: Moderate hydronephrosis on the right. Right renal cyst. Electronically Signed   By: Inez Catalina M.D.   On: 11/05/2016 10:07        Scheduled Meds: . tamsulosin  0.4 mg Oral QPC supper   Continuous Infusions: . cefTRIAXone (ROCEPHIN)  IV Stopped (11/06/16 7681)     LOS: 2 days    Time spent: 30 minutes    Loretha Stapler, MD [facility] Pager [phone]  If 7PM-7AM, please contact night-coverage [email or web address] Password [email or web address] 11/06/2016, 4:42 PM

## 2016-11-06 NOTE — Progress Notes (Signed)
*  PRELIMINARY RESULTS* Echocardiogram 2D Echocardiogram has been performed.  [name] 11/06/2016, 1:13 PM

## 2016-11-06 NOTE — Progress Notes (Signed)
Urinary catheter removed per physician orders, patient tolerated it well.

## 2016-11-06 NOTE — Progress Notes (Signed)
ANTICOAGULATION CONSULT NOTE - Initial Consult  Pharmacy Consult for lovenox Indication: atrial fibrillation  No Known Allergies  Patient Measurements: Height: 5\' 11"  (180.3 cm) Weight: 193 lb 4.8 oz (87.7 kg) IBW/kg (Calculated) : 75.3 Heparin Dosing Weight: 87.7 kg  Vital Signs: Temp: 98.2 F (36.8 C) (10/17 0539) Temp Source: Oral (10/17 0539) BP: 118/76 (10/17 0539) Pulse Rate: 103 (10/17 0539)  Labs:  Recent Labs  11/04/16 1917 11/05/16 0500 11/06/16 0613  HGB 16.0 14.3 12.5*  HCT 47.1 42.0 37.3*  PLT 230 203 203  CREATININE 2.75* 1.75* 1.02    Estimated Creatinine Clearance: 58.4 mL/min (by C-G formula based on SCr of 1.02 mg/dL).   Medical History: History reviewed. No pertinent past medical history.  Medications:  No prescriptions prior to admission.    Assessment: 83 yo man to start lovenox for afib Goal of Therapy:  Anti-Xa level 0.6-1 units/ml 4hrs after LMWH dose given Monitor platelets by anticoagulation protocol: Yes   Plan:  Lovenox 90 mg sq q12 hours F/u CBC q3 days while on lovenox  Makayia Duplessis Poteet 11/06/2016,5:12 PM

## 2016-11-06 NOTE — Consult Note (Signed)
Urology Consult  Referring physician: Dr. Adair Patter Reason for referral: right hydronephrosis and ureteral calculus  Chief Complaint: gross hematuria, urinary urgency  History of Present Illness: Mr Mcmillon is a 83yo with no significant past medical history who presented to the ER yesterday with urinary retention and gross hematuria. For the past week he has noticed worsening urinary urgency, urinary frequency and intermittent gross hematuria. He denies any suprapubic or flank pain. No history of LUTS prior to 1 week ago. No hx of nephrolithiasis. CT abd/pelvis without contrast revealed a 1cm distal ureteral calculus with mild hydronephrosis. Creatinine was elevated at 2.75 on admission but has normalized to 1.02. WBC count is 10.4. No fevers. No other associated symptoms. No exacerbating/alleviaitng events.   History reviewed. No pertinent past medical history. Past Surgical History:  Procedure Laterality Date  . HERNIA REPAIR  1974    Medications: I have reviewed the patient's current medications. Allergies: No Known Allergies  Family History  Problem Relation Age of Onset  . Dementia Mother   . Lung cancer Father        smoker  . CAD Father        MI at age 93   Social History:  reports that he has never smoked. He has never used smokeless tobacco. He reports that he drinks alcohol. He reports that he does not use drugs.  Review of Systems  Genitourinary: Positive for frequency, hematuria and urgency.  All other systems reviewed and are negative.   Physical Exam:  Vital signs in last 24 hours: Temp:  [97.8 F (36.6 C)-98.3 F (36.8 C)] 98.3 F (36.8 C) (10/17 1711) Pulse Rate:  [72-103] 72 (10/17 1711) Resp:  [16-18] 16 (10/17 1711) BP: (118-142)/(76-82) 131/82 (10/17 1711) SpO2:  [93 %-98 %] 93 % (10/17 1954) Weight:  [87.7 kg (193 lb 4.8 oz)] 87.7 kg (193 lb 4.8 oz) (10/17 0500) Physical Exam  Constitutional: He is oriented to person, place, and time. He appears  well-developed and well-nourished.  HENT:  Head: Normocephalic and atraumatic.  Eyes: Pupils are equal, round, and reactive to light. EOM are normal.  Neck: Normal range of motion. No thyromegaly present.  Cardiovascular: Normal rate and regular rhythm.   Respiratory: Effort normal. No respiratory distress.  GI: Soft. He exhibits no distension. Hernia confirmed negative in the right inguinal area and confirmed negative in the left inguinal area.  Genitourinary: Testes normal and penis normal.  Musculoskeletal: Normal range of motion. He exhibits no edema.  Lymphadenopathy:       Right: No inguinal adenopathy present.       Left: No inguinal adenopathy present.  Neurological: He is alert and oriented to person, place, and time.  Skin: Skin is warm and dry.  Psychiatric: He has a normal mood and affect. His behavior is normal. Judgment and thought content normal.    Laboratory Data:  Results for orders placed or performed during the hospital encounter of 11/04/16 (from the past 72 hour(s))  Urinalysis, Routine w reflex microscopic     Status: Abnormal   Collection Time: 11/04/16  5:20 PM  Result Value Ref Range   Color, Urine YELLOW YELLOW   APPearance HAZY (A) CLEAR   Specific Gravity, Urine 1.014 1.005 - 1.030   pH 5.0 5.0 - 8.0   Glucose, UA 50 (A) NEGATIVE mg/dL   Hgb urine dipstick LARGE (A) NEGATIVE   Bilirubin Urine NEGATIVE NEGATIVE   Ketones, ur NEGATIVE NEGATIVE mg/dL   Protein, ur 30 (A) NEGATIVE mg/dL  Nitrite NEGATIVE NEGATIVE   Leukocytes, UA NEGATIVE NEGATIVE   RBC / HPF TOO NUMEROUS TO COUNT 0 - 5 RBC/hpf   WBC, UA TOO NUMEROUS TO COUNT 0 - 5 WBC/hpf   Bacteria, UA NONE SEEN NONE SEEN   Squamous Epithelial / LPF 0-5 (A) NONE SEEN   WBC Clumps PRESENT    Mucus PRESENT    Hyaline Casts, UA PRESENT    Granular Casts, UA PRESENT   Lipase, blood     Status: Abnormal   Collection Time: 11/04/16  7:17 PM  Result Value Ref Range   Lipase 80 (H) 11 - 51 U/L   Comprehensive metabolic panel     Status: Abnormal   Collection Time: 11/04/16  7:17 PM  Result Value Ref Range   Sodium 136 135 - 145 mmol/L   Potassium 4.3 3.5 - 5.1 mmol/L   Chloride 101 101 - 111 mmol/L   CO2 22 22 - 32 mmol/L   Glucose, Bld 129 (H) 65 - 99 mg/dL   BUN 39 (H) 6 - 20 mg/dL   Creatinine, Ser 2.75 (H) 0.61 - 1.24 mg/dL   Calcium 9.1 8.9 - 10.3 mg/dL   Total Protein 7.3 6.5 - 8.1 g/dL   Albumin 4.0 3.5 - 5.0 g/dL   AST 24 15 - 41 U/L   ALT 14 (L) 17 - 63 U/L   Alkaline Phosphatase 57 38 - 126 U/L   Total Bilirubin 1.0 0.3 - 1.2 mg/dL   GFR calc non Af Amer 20 (L) >60 mL/min   GFR calc Af Amer 23 (L) >60 mL/min    Comment: (NOTE) The eGFR has been calculated using the CKD EPI equation. This calculation has not been validated in all clinical situations. eGFR's persistently <60 mL/min signify possible Chronic Kidney Disease.    Anion gap 13 5 - 15  CBC     Status: Abnormal   Collection Time: 11/04/16  7:17 PM  Result Value Ref Range   WBC 18.5 (H) 4.0 - 10.5 K/uL   RBC 5.07 4.22 - 5.81 MIL/uL   Hemoglobin 16.0 13.0 - 17.0 g/dL   HCT 47.1 39.0 - 52.0 %   MCV 92.9 78.0 - 100.0 fL   MCH 31.6 26.0 - 34.0 pg   MCHC 34.0 30.0 - 36.0 g/dL   RDW 13.5 11.5 - 15.5 %   Platelets 230 150 - 400 K/uL  Urine culture     Status: None   Collection Time: 11/04/16 10:40 PM  Result Value Ref Range   Specimen Description URINE, RANDOM    Special Requests NONE    Culture      NO GROWTH Performed at [facility], [place] [address] [place], [place] [address]    Report Status 11/06/2016 FINAL   Sodium, urine, random     Status: None   Collection Time: 11/05/16  1:27 AM  Result Value Ref Range   Sodium, Ur 38 mmol/L  Calcium / creatinine ratio, urine     Status: None   Collection Time: 11/05/16  1:27 AM  Result Value Ref Range   Calcium, Ur 1.3 Not Estab. mg/dL   Calcium/Creat.Ratio 13 0 - 260 mg/g creat    Comment: (NOTE) Performed At: [facility] [address] [place], [place] [id number] Lindon Romp MD TW:[id number]    Creatinine, Urine 103.3 Not Estab. mg/dL  Comprehensive metabolic panel     Status: Abnormal   Collection Time: 11/05/16  5:00 AM  Result Value Ref  Range   Sodium 138 135 - 145 mmol/L   Potassium 3.8 3.5 - 5.1 mmol/L   Chloride 105 101 - 111 mmol/L   CO2 22 22 - 32 mmol/L   Glucose, Bld 105 (H) 65 - 99 mg/dL   BUN 38 (H) 6 - 20 mg/dL   Creatinine, Ser 1.75 (H) 0.61 - 1.24 mg/dL   Calcium 8.5 (L) 8.9 - 10.3 mg/dL   Total Protein 6.0 (L) 6.5 - 8.1 g/dL   Albumin 3.2 (L) 3.5 - 5.0 g/dL   AST 18 15 - 41 U/L   ALT 10 (L) 17 - 63 U/L   Alkaline Phosphatase 44 38 - 126 U/L   Total Bilirubin 0.9 0.3 - 1.2 mg/dL   GFR calc non Af Amer 34 (L) >60 mL/min   GFR calc Af Amer 40 (L) >60 mL/min    Comment: (NOTE) The eGFR has been calculated using the CKD EPI equation. This calculation has not been validated in all clinical situations. eGFR's persistently <60 mL/min signify possible Chronic Kidney Disease.    Anion gap 11 5 - 15  CBC     Status: Abnormal   Collection Time: 11/05/16  5:00 AM  Result Value Ref Range   WBC 14.4 (H) 4.0 - 10.5 K/uL   RBC 4.54 4.22 - 5.81 MIL/uL   Hemoglobin 14.3 13.0 - 17.0 g/dL   HCT 42.0 39.0 - 52.0 %   MCV 92.5 78.0 - 100.0 fL   MCH 31.5 26.0 - 34.0 pg   MCHC 34.0 30.0 - 36.0 g/dL   RDW 13.5 11.5 - 15.5 %   Platelets 203 150 - 400 K/uL  TSH     Status: None   Collection Time: 11/05/16 11:00 AM  Result Value Ref Range   TSH 0.588 0.350 - 4.500 uIU/mL    Comment: Performed by a 3rd Generation assay with a functional sensitivity of <=0.01 uIU/mL.  Hemoglobin A1c     Status: None   Collection Time: 11/05/16 11:00 AM  Result Value Ref Range   Hgb A1c MFr Bld 5.5 4.8 - 5.6 %    Comment: (NOTE) Pre diabetes:          5.7%-6.4% Diabetes:              >6.4% Glycemic control for   <7.0% adults with diabetes    Mean Plasma Glucose 111.15 mg/dL    Comment: Performed at [facility] [address] [place], [place] [address]  T4, free     Status: None   Collection Time: 11/05/16 11:00 AM  Result Value Ref Range   Free T4 1.09 0.61 - 1.12 ng/dL    Comment: (NOTE) Biotin ingestion may interfere with free T4 tests. If the results are inconsistent with the TSH level, previous test results, or the clinical presentation, then consider biotin interference. If needed, order repeat testing after stopping biotin. Performed at [facility], [place] [address], [place], [place] [address]   Basic metabolic panel     Status: Abnormal   Collection Time: 11/06/16  6:13 AM  Result Value Ref Range   Sodium 137 135 - 145 mmol/L   Potassium 3.4 (L) 3.5 - 5.1 mmol/L   Chloride 106 101 - 111 mmol/L   CO2 23 22 - 32 mmol/L   Glucose, Bld 109 (H) 65 - 99 mg/dL   BUN 27 (H) 6 - 20 mg/dL   Creatinine, Ser 1.02 0.61 - 1.24 mg/dL   Calcium 7.9 (  L) 8.9 - 10.3 mg/dL   GFR calc non Af Amer >60 >60 mL/min   GFR calc Af Amer >60 >60 mL/min    Comment: (NOTE) The eGFR has been calculated using the CKD EPI equation. This calculation has not been validated in all clinical situations. eGFR's persistently <60 mL/min signify possible Chronic Kidney Disease.    Anion gap 8 5 - 15  CBC     Status: Abnormal   Collection Time: 11/06/16  6:13 AM  Result Value Ref Range   WBC 10.4 4.0 - 10.5 K/uL   RBC 4.00 (L) 4.22 - 5.81 MIL/uL   Hemoglobin 12.5 (L) 13.0 - 17.0 g/dL   HCT 37.3 (L) 39.0 - 52.0 %   MCV 93.3 78.0 - 100.0 fL   MCH 31.3 26.0 - 34.0 pg   MCHC 33.5 30.0 - 36.0 g/dL   RDW 13.5 11.5 - 15.5 %   Platelets 203 150 - 400 K/uL   Recent Results (from the past 240 hour(s))  Urine culture     Status: None   Collection Time: 11/04/16 10:40 PM  Result Value Ref Range Status   Specimen Description URINE, RANDOM  Final   Special Requests NONE  Final   Culture   Final    NO GROWTH Performed at [facility], [place] [address] [place], [place] [address]    Report Status  11/06/2016 FINAL  Final   Creatinine:  Recent Labs  11/04/16 1917 11/05/16 0500 11/06/16 4034  CREATININE 2.75* 1.75* 1.02   Baseline Creatinine: 1  Impression/Assessment:  83yo with a right distal ureteral calculus and new onset urinary frequency and urgency  Plan:  1. Right distal ureteral calculus: We discussed the management of ureteral calculi including medical expulsive therapy, ESWL and ureteroscopy with laser lithotripsy. He patient agreed to attempt medical expulsive therapy. Please continue flomax 0.6m daily. Percocet PRN for pain. He should followup in 1 week in my office with a KUB  2. New onset urinary frequency and urinary urgency: His new lower urinary tract symptoms are likely related to his distal ureteral calculus. Please continue flomax 0.434mdaily. The foley catheter can be discontinued.   PaNicolette Bang0/17/2018, 9:14 PM

## 2016-11-07 LAB — CBC
HCT: 39.2 % (ref 39.0–52.0)
HEMOGLOBIN: 13.3 g/dL (ref 13.0–17.0)
MCH: 31.8 pg (ref 26.0–34.0)
MCHC: 33.9 g/dL (ref 30.0–36.0)
MCV: 93.8 fL (ref 78.0–100.0)
PLATELETS: 209 10*3/uL (ref 150–400)
RBC: 4.18 MIL/uL — ABNORMAL LOW (ref 4.22–5.81)
RDW: 13.2 % (ref 11.5–15.5)
WBC: 8.1 10*3/uL (ref 4.0–10.5)

## 2016-11-07 MED ORDER — TAMSULOSIN HCL 0.4 MG PO CAPS: 0.4000 mg | ORAL_CAPSULE | Freq: Every day | ORAL | 0 refills | Status: DC

## 2016-11-07 MED ORDER — RIVAROXABAN 20 MG PO TABS
20.0000 mg | ORAL_TABLET | Freq: Every day | ORAL | Status: DC
  Administered 2016-11-07: 20 mg via ORAL
  Filled 2016-11-07: qty 1

## 2016-11-07 MED ORDER — RIVAROXABAN 20 MG PO TABS: 20.0000 mg | ORAL_TABLET | Freq: Every day | ORAL | 0 refills | Status: DC

## 2016-11-07 NOTE — Progress Notes (Signed)
Pts foley catheter removed yesterday during day shift around 1800, pt has not yet voided but complaining of the urge to go. Pt was bladder scanned with >31mL in bladder. Pt tried to urinate one more time with no success. Dr. Hilbert Bible paged and made aware. Waiting for orders.

## 2016-11-07 NOTE — Progress Notes (Signed)
There was no response to page from Dr. Hilbert Bible about urinary retention, no urination after foley catheter removal, and bladder scan amount of >300. Dr. Darrick Meigs now paged and made aware.  New order for foley catheter to be replaced. Pt educated.

## 2016-11-07 NOTE — Discharge Instructions (Signed)
Information on my medicine - XARELTO (Rivaroxaban)  This medication education was reviewed with me or my healthcare representative as part of my discharge preparation.  The pharmacist that spoke with me during my hospital stay was:  Ena Dawley, [facility]  Why was Xarelto prescribed for you? Xarelto was prescribed for you to reduce the risk of a blood clot forming that can cause a stroke if you have a medical condition called atrial fibrillation (a type of irregular heartbeat).  What do you need to know about xarelto ? Take your Xarelto ONCE DAILY at the same time every day with your evening meal. If you have difficulty swallowing the tablet whole, you may crush it and mix in applesauce just prior to taking your dose.  Take Xarelto exactly as prescribed by your doctor and DO NOT stop taking Xarelto without talking to the doctor who prescribed the medication.  Stopping without other stroke prevention medication to take the place of Xarelto may increase your risk of developing a clot that causes a stroke.  Refill your prescription before you run out.  After discharge, you should have regular check-up appointments with your healthcare provider that is prescribing your Xarelto.  In the future your dose may need to be changed if your kidney function or weight changes by a significant amount.  What do you do if you miss a dose? If you are taking Xarelto ONCE DAILY and you miss a dose, take it as soon as you remember on the same day then continue your regularly scheduled once daily regimen the next day. Do not take two doses of Xarelto at the same time or on the same day.   Important Safety Information A possible side effect of Xarelto is bleeding. You should call your healthcare provider right away if you experience any of the following: ? Bleeding from an injury or your nose that does not stop. ? Unusual colored urine (red or dark brown) or unusual colored stools (red or black). ? Unusual  bruising for unknown reasons. ? A serious fall or if you hit your head (even if there is no bleeding).  Some medicines may interact with Xarelto and might increase your risk of bleeding while on Xarelto. To help avoid this, consult your healthcare provider or pharmacist prior to using any new prescription or non-prescription medications, including herbals, vitamins, non-steroidal anti-inflammatory drugs (NSAIDs) and supplements.  This website has more information on Xarelto: [email or web address]

## 2016-11-07 NOTE — Progress Notes (Signed)
ANTICOAGULATION CONSULT NOTE - Initial Consult  Pharmacy Consult for XARELTO Indication: atrial fibrillation  No Known Allergies  Patient Measurements: Height: 5\' 11"  (180.3 cm) Weight: 194 lb 7.1 oz (88.2 kg) IBW/kg (Calculated) : 75.3  Vital Signs: Temp: 98.2 F (36.8 C) (10/18 0454) Temp Source: Oral (10/18 0454) BP: 143/89 (10/18 0454) Pulse Rate: 74 (10/18 0454)  Labs:  Recent Labs  11/04/16 1917 11/05/16 0500 11/06/16 0613  HGB 16.0 14.3 12.5*  HCT 47.1 42.0 37.3*  PLT 230 203 203  CREATININE 2.75* 1.75* 1.02   Estimated Creatinine Clearance: 58.4 mL/min (by C-G formula based on SCr of 1.02 mg/dL).  Medical History: History reviewed. No pertinent past medical history.  Medications:  No prescriptions prior to admission.    Assessment: 83yo male with new onset Afib.  Asked to initiate Xarelto.  Last dose of Lovenox was ~6am this morning.  Goal of Therapy:  Monitor platelets by anticoagulation protocol: Yes   Plan:  Xarelto 20mg  po daily w/ supper (d/c Lovenox now) Provide education Monitor CBC  [place] Crane, Margaret Cockerill A 11/07/2016,11:27 AM

## 2016-11-07 NOTE — Discharge Summary (Signed)
Physician Discharge Summary  Johnny Patel IRW:[id number] DOB: [birth year] DOA: 11/04/2016  PCP: Patient, No Pcp Per  Admit date: 11/04/2016 Discharge date: 11/07/2016  Admitted From: Home  Disposition:  Home   Recommendations for Outpatient Follow-up:  1. Establish care with PCP within the next month 2. Follow up with Urology within the next week 3. Please obtain BMP/CBC in one week 4. Take medications as prescribed 5. Return to ED with bleeding, or significant decrease in urine output  Home Health:No Equipment/Devices: None  Discharge Condition:Stable CODE STATUS:Full Code Diet recommendation: Heart Healthy  Brief/Interim Summary: [name] a 83 y.o.male,w c/o inability to urinate starting on Friday nite. Pt was unable to urinate very much and therefore went to urgent care in [place], [place] and was told to go to ED for evaluation. No significant past medical history who presented to the ER yesterday with urinary retention and gross hematuria. For the past week he has noticed worsening urinary urgency, urinary frequency and intermittent gross hematuria. He denies any suprapubic or flank pain. No history of LUTS prior to 1 week ago. No hx of nephrolithiasis. CT abd/pelvis without contrast revealed a 1cm distal ureteral calculus with mild hydronephrosis. Creatinine was elevated at 2.75 on admission but has normalized to 1.02. WBC count is 10.4. No fevers. No other associated symptoms. No exacerbating/alleviaitng events  Pt will be admitted for ARF, and UTI, bladder outlet obstruction.  Urology consulted for hydronephrosis.  Patient found to have numerous nephrolithiasis.  Urology discussed options with patient who opted for medical management.  Flomax was started. Foley catheter which had been placed at admission was discontinued but patient had difficulty urinating without catheter.  He was told to follow up within 1 week with urology.  Was found to have atrial fibrillation during  admission but was rate controlled.  Discussed started anticoagulation and patient opted for Xarelto.  Discussed anticoagulation options at length with patient.  Started on Xarelto prior to discharge.  Return precautions were given to patient prior to discharge.   Discharge Diagnoses:  Principal Problem:   ARF (acute renal failure) (HCC) Active Problems:   UTI (urinary tract infection)   Hyperglycemia   AKI (acute kidney injury) ([place])   Obstructive uropathy   Acute pyelonephritis   Unspecified atrial fibrillation [facility])    Discharge Instructions  Discharge Instructions    Call MD for:  difficulty breathing, headache or visual disturbances    Complete by:  As directed    Call MD for:  extreme fatigue    Complete by:  As directed    Call MD for:  hives    Complete by:  As directed    Call MD for:  persistant dizziness or light-headedness    Complete by:  As directed    Call MD for:  persistant nausea and vomiting    Complete by:  As directed    Call MD for:  redness, tenderness, or signs of infection (pain, swelling, redness, odor or green/yellow discharge around incision site)    Complete by:  As directed    Call MD for:  severe uncontrolled pain    Complete by:  As directed    Call MD for:  temperature >100.4    Complete by:  As directed    Diet - low sodium heart healthy    Complete by:  As directed    Discharge instructions    Complete by:  As directed    Please get set up to establish care with a primary care  physician Schedule a follow up with Urology next week   Discharge instructions    Complete by:  As directed    Return to ED with any bleeding, or lack of output from foley catheter   Increase activity slowly    Complete by:  As directed      Allergies as of 11/07/2016   No Known Allergies     Medication List    TAKE these medications   rivaroxaban 20 MG Tabs tablet Commonly known as:  XARELTO Take 1 tablet (20 mg total) by mouth daily with supper.    tamsulosin 0.4 MG Caps capsule Commonly known as:  FLOMAX Take 1 capsule (0.4 mg total) by mouth daily after supper.      Follow-up Information    Franchot Gallo, MD. Schedule an appointment as soon as possible for a visit in 5 day(s).   Specialty:  Urology Contact information: [address] [place] [address] [phone]          No Known Allergies  Consultations:  Urology   Procedures/Studies: Ct Abdomen Pelvis Wo Contrast  Result Date: 11/05/2016 CLINICAL DATA:  Hydronephrosis.  Dysuria. EXAM: CT ABDOMEN AND PELVIS WITHOUT CONTRAST TECHNIQUE: Multidetector CT imaging of the abdomen and pelvis was performed following the standard protocol without IV contrast. COMPARISON:  None. FINDINGS: Lower chest: Scar versus subsegmental atelectasis identified in the lung bases. Hepatobiliary: Cysts identified within the liver. No suspicious liver abnormalities. Stone within the neck of gallbladder measures 2.3 cm, image 33 of series 2. Pancreas: Unremarkable. No pancreatic ductal dilatation or surrounding inflammatory changes. Spleen: Normal in size without focal abnormality. Adrenals/Urinary Tract: The adrenal glands appear normal. Stones within the inferior pole of the right kidney noted measuring up to 5 mm. There is right-sided hydronephrosis and hydroureter. Large stone within the distal right ureter measures 0.8 x 1.0 cm, image 79 of series 2. Bilateral kidney lesions of varying density are incompletely characterized without IV contrast. No left renal calculi or hydronephrosis identified. The urinary bladder is collapsed around a Foley catheter balloon. Gas noted within the bladder is likely related to instrumentation. Mild bladder wall thickening is identified with surrounding fat stranding. Stomach/Bowel: The stomach is normal. There is wall thickening and fat stranding associated with the second and third portions of the duodenum. Suspicious for duodenitis. No abnormal  bowel dilatation identified. Unremarkable appearance of the colon. Vascular/Lymphatic: Aortic atherosclerosis. No aneurysm. No adenopathy within the abdomen or pelvis. No inguinal adenopathy. Reproductive: The prostate gland measures 6.9 x 7.3 x 10.5 cm (volume = 280 cm^3) Other: No free fluid or fluid collections identified. Musculoskeletal: Mild degenerative disc disease within the lumbar spine. No aggressive lytic or sclerotic bone lesions. IMPRESSION: 1. Right-sided hydronephrosis and hydroureter is identified secondary to 8 x 10 mm distal ureteral calculus. 2. Marked prostate gland enlargement. Bladder wall thickening may be the sequelae of chronic bladder outlet obstruction. There is fat stranding surrounding the bladder wall may reflect underlying cystitis. 3. Wall thickening and fat stranding involving the second and third portions of the duodenum. Cannot rule out duodenitis 4.  Aortic Atherosclerosis (ICD10-I70.0). 5. Gallstone. Electronically Signed   By: Kerby Moors M.D.   On: 11/05/2016 20:34   US Renal  Result Date: 11/05/2016 CLINICAL DATA:  Acute renal failure EXAM: RENAL / URINARY TRACT ULTRASOUND COMPLETE COMPARISON:  None. FINDINGS: Right Kidney: Length: 12.0 cm. Moderate hydronephrosis is noted. A 2.1 cm cyst is noted centrally adjacent to the renal pelvis. Left Kidney: Length:  11.4 cm. Echogenicity within normal limits. No mass or hydronephrosis visualized. Bladder: Decompressed by Foley catheter. IMPRESSION: Moderate hydronephrosis on the right. Right renal cyst. Electronically Signed   By: Inez Catalina M.D.   On: 11/05/2016 10:07     Subjective: Patient had foley catheter removed today in attempt to urinate.  He was unsuccessful in urinating without catheter and therefore catheter was placed and he was discharged.  Denies hematuria.  Discharge Exam: Vitals:   11/07/16 0454 11/07/16 1300  BP: (!) 143/89 132/70  Pulse: 74 75  Resp: 14 16  Temp: 98.2 F (36.8 C) (!) 97.4 F  (36.3 C)  SpO2: 94% 97%   Vitals:   11/06/16 1954 11/06/16 2124 11/07/16 0454 11/07/16 1300  BP:  (!) 152/89 (!) 143/89 132/70  Pulse:  83 74 75  Resp:  20 14 16   Temp:  98.1 F (36.7 C) 98.2 F (36.8 C) (!) 97.4 F (36.3 C)  TempSrc:  Oral Oral   SpO2: 93% 100% 94% 97%  Weight:   88.2 kg (194 lb 7.1 oz)   Height:        General: Pt is alert, awake, not in acute distress Cardiovascular: IRRR, S1/S2 +, no rubs, no gallops Respiratory: CTA bilaterally, no wheezing, no rhonchi Abdominal: Soft, NT, ND, bowel sounds + Extremities: no edema, no cyanosis    The results of significant diagnostics from this hospitalization (including imaging, microbiology, ancillary and laboratory) are listed below for reference.     Microbiology: Recent Results (from the past 240 hour(s))  Urine culture     Status: None   Collection Time: 11/04/16 10:40 PM  Result Value Ref Range Status   Specimen Description URINE, RANDOM  Final   Special Requests NONE  Final   Culture   Final    NO GROWTH Performed at [facility], [address], [place], [place] [address]    Report Status 11/06/2016 FINAL  Final     Labs: BNP (last 3 results) No results for input(s): BNP in the last 8760 hours. Basic Metabolic Panel:  Recent Labs Lab 11/04/16 1917 11/05/16 0500 11/06/16 0613  NA 136 138 137  K 4.3 3.8 3.4*  CL 101 105 106  CO2 22 22 23   GLUCOSE 129* 105* 109*  BUN 39* 38* 27*  CREATININE 2.75* 1.75* 1.02  CALCIUM 9.1 8.5* 7.9*   Liver Function Tests:  Recent Labs Lab 11/04/16 1917 11/05/16 0500  AST 24 18  ALT 14* 10*  ALKPHOS 57 44  BILITOT 1.0 0.9  PROT 7.3 6.0*  ALBUMIN 4.0 3.2*    Recent Labs Lab 11/04/16 1917  LIPASE 80*   No results for input(s): AMMONIA in the last 168 hours. CBC:  Recent Labs Lab 11/04/16 1917 11/05/16 0500 11/06/16 0613 11/07/16 1156  WBC 18.5* 14.4* 10.4 8.1  HGB 16.0 14.3 12.5* 13.3  HCT 47.1 42.0 37.3* 39.2  MCV 92.9 92.5 93.3  93.8  PLT 230 203 203 209   Cardiac Enzymes: No results for input(s): CKTOTAL, CKMB, CKMBINDEX, TROPONINI in the last 168 hours. BNP: Invalid input(s): POCBNP CBG: No results for input(s): GLUCAP in the last 168 hours. D-Dimer No results for input(s): DDIMER in the last 72 hours. Hgb A1c  Recent Labs  11/05/16 1100  HGBA1C 5.5   Lipid Profile No results for input(s): CHOL, HDL, LDLCALC, TRIG, CHOLHDL, LDLDIRECT in the last 72 hours. Thyroid function studies  Recent Labs  11/05/16 1100  TSH 0.588   Anemia work up  No results for input(s): VITAMINB12, FOLATE, FERRITIN, TIBC, IRON, RETICCTPCT in the last 72 hours. Urinalysis    Component Value Date/Time   COLORURINE YELLOW 11/04/2016 1720   APPEARANCEUR HAZY (A) 11/04/2016 1720   LABSPEC 1.014 11/04/2016 1720   PHURINE 5.0 11/04/2016 1720   GLUCOSEU 50 (A) 11/04/2016 1720   HGBUR LARGE (A) 11/04/2016 1720   BILIRUBINUR NEGATIVE 11/04/2016 1720   KETONESUR NEGATIVE 11/04/2016 1720   PROTEINUR 30 (A) 11/04/2016 1720   NITRITE NEGATIVE 11/04/2016 1720   LEUKOCYTESUR NEGATIVE 11/04/2016 1720   Sepsis Labs Invalid input(s): PROCALCITONIN,  WBC,  LACTICIDVEN Microbiology Recent Results (from the past 240 hour(s))  Urine culture     Status: None   Collection Time: 11/04/16 10:40 PM  Result Value Ref Range Status   Specimen Description URINE, RANDOM  Final   Special Requests NONE  Final   Culture   Final    NO GROWTH Performed at [facility], [place] [address], [place], [place] [address]    Report Status 11/06/2016 FINAL  Final     Time coordinating discharge: Over 30 minutes  SIGNED:   Loretha Stapler, MD  [facility] 11/07/2016, 6:09 PM Pager [phone] If 7PM-7AM, please contact night-coverage [email or web address] Password [email or web address]

## 2016-11-08 NOTE — Progress Notes (Signed)
Patient discharged home per orders. Discharge instructions reviewed with patient and significant other. Education of emptying foley bag, signs and symptoms of infection, and peri-care/foley care was done. Patient and significant other verbalizes understanding. IV discontinued per orders. Patient tolerated well. Belongings packed up by significant other. Prescription sent electronically. Denies needs at this time. Patient has been waiting for urinary leg bag to be brought to unit by Nursing Supervisor. Since leg bag still not available, Bree, Agricultural consultant, states that patient can go home with standard urinary drainage bag and instructed to return to unit tomorrow to have bag changed to leg bag with education. Patient agrees, and states that he may be able to get into office for Urology follow-up before this is necessary.  Patient off unit via w/c by [place], NT.

## 2016-11-13 DIAGNOSIS — R338 Other retention of urine: Secondary | ICD-10-CM | POA: Diagnosis not present

## 2016-11-13 DIAGNOSIS — N202 Calculus of kidney with calculus of ureter: Secondary | ICD-10-CM | POA: Diagnosis not present

## 2016-11-14 ENCOUNTER — Other Ambulatory Visit: Payer: Self-pay | Admitting: Urology

## 2016-11-18 ENCOUNTER — Encounter (HOSPITAL_COMMUNITY): Payer: Self-pay | Admitting: *Deleted

## 2016-11-21 ENCOUNTER — Encounter (HOSPITAL_COMMUNITY)
Admission: RE | Disposition: A | Discharge status: Home / Self Care | Payer: Self-pay | Source: Ambulatory Visit | Attending: Urology

## 2016-11-21 ENCOUNTER — Ambulatory Visit (HOSPITAL_COMMUNITY): Payer: Medicare Other | Admitting: Registered Nurse

## 2016-11-21 ENCOUNTER — Observation Stay (HOSPITAL_COMMUNITY)
Admission: RE | Admit: 2016-11-21 | Discharge: 2016-11-22 | Disposition: A | Discharge status: Home / Self Care | Payer: Medicare Other | Source: Ambulatory Visit | Attending: Urology | Admitting: Urology

## 2016-11-21 ENCOUNTER — Encounter (HOSPITAL_COMMUNITY): Payer: Self-pay | Admitting: Emergency Medicine

## 2016-11-21 ENCOUNTER — Observation Stay (HOSPITAL_COMMUNITY): Payer: Medicare Other

## 2016-11-21 DIAGNOSIS — I4891 Unspecified atrial fibrillation: Secondary | ICD-10-CM | POA: Diagnosis not present

## 2016-11-21 DIAGNOSIS — N390 Urinary tract infection, site not specified: Secondary | ICD-10-CM | POA: Diagnosis not present

## 2016-11-21 DIAGNOSIS — R310 Gross hematuria: Secondary | ICD-10-CM | POA: Diagnosis not present

## 2016-11-21 DIAGNOSIS — J449 Chronic obstructive pulmonary disease, unspecified: Secondary | ICD-10-CM | POA: Diagnosis not present

## 2016-11-21 DIAGNOSIS — N10 Acute tubulo-interstitial nephritis: Secondary | ICD-10-CM | POA: Diagnosis not present

## 2016-11-21 DIAGNOSIS — N201 Calculus of ureter: Secondary | ICD-10-CM | POA: Diagnosis not present

## 2016-11-21 DIAGNOSIS — Z87891 Personal history of nicotine dependence: Secondary | ICD-10-CM | POA: Insufficient documentation

## 2016-11-21 DIAGNOSIS — N132 Hydronephrosis with renal and ureteral calculous obstruction: Principal | ICD-10-CM | POA: Insufficient documentation

## 2016-11-21 DIAGNOSIS — R109 Unspecified abdominal pain: Secondary | ICD-10-CM | POA: Diagnosis present

## 2016-11-21 HISTORY — DX: Personal history of urinary calculi: Z87.442

## 2016-11-21 HISTORY — PX: HOLMIUM LASER APPLICATION: SHX5852

## 2016-11-21 HISTORY — PX: CYSTOSCOPY WITH RETROGRADE PYELOGRAM, URETEROSCOPY AND STENT PLACEMENT: SHX5789

## 2016-11-21 SURGERY — CYSTOURETEROSCOPY, WITH RETROGRADE PYELOGRAM AND STENT INSERTION
Anesthesia: General | Laterality: Right

## 2016-11-21 MED ORDER — FENTANYL CITRATE (PF) 100 MCG/2ML IJ SOLN
INTRAMUSCULAR | Status: AC
  Filled 2016-11-21: qty 2

## 2016-11-21 MED ORDER — STERILE WATER FOR IRRIGATION IR SOLN
Status: DC | PRN
  Administered 2016-11-21: 3000 mL via INTRAVESICAL

## 2016-11-21 MED ORDER — IOHEXOL 300 MG/ML  SOLN
INTRAMUSCULAR | Status: DC | PRN
  Administered 2016-11-21: 8 mL

## 2016-11-21 MED ORDER — FENTANYL CITRATE (PF) 100 MCG/2ML IJ SOLN: 25.0000 ug | INTRAMUSCULAR | Status: DC | PRN

## 2016-11-21 MED ORDER — LIDOCAINE 2% (20 MG/ML) 5 ML SYRINGE
INTRAMUSCULAR | Status: AC
  Filled 2016-11-21: qty 5

## 2016-11-21 MED ORDER — SUCCINYLCHOLINE CHLORIDE 200 MG/10ML IV SOSY
PREFILLED_SYRINGE | INTRAVENOUS | Status: DC | PRN
  Administered 2016-11-21: 100 mg via INTRAVENOUS

## 2016-11-21 MED ORDER — DEXAMETHASONE SODIUM PHOSPHATE 10 MG/ML IJ SOLN
INTRAMUSCULAR | Status: DC | PRN
  Administered 2016-11-21: 10 mg via INTRAVENOUS

## 2016-11-21 MED ORDER — PHENYLEPHRINE 40 MCG/ML (10ML) SYRINGE FOR IV PUSH (FOR BLOOD PRESSURE SUPPORT)
PREFILLED_SYRINGE | INTRAVENOUS | Status: DC | PRN
Start: 2016-11-21 — End: 2016-11-21
  Administered 2016-11-21: 40 ug via INTRAVENOUS
  Administered 2016-11-21: 80 ug via INTRAVENOUS
  Administered 2016-11-21 (×2): 40 ug via INTRAVENOUS

## 2016-11-21 MED ORDER — ONDANSETRON HCL 4 MG/2ML IJ SOLN
INTRAMUSCULAR | Status: AC
  Filled 2016-11-21: qty 2

## 2016-11-21 MED ORDER — PROPOFOL 10 MG/ML IV BOLUS
INTRAVENOUS | Status: AC
  Filled 2016-11-21: qty 20

## 2016-11-21 MED ORDER — LACTATED RINGERS IV SOLN
INTRAVENOUS | Status: DC
  Administered 2016-11-21 (×2): via INTRAVENOUS

## 2016-11-21 MED ORDER — DIPHENHYDRAMINE HCL 50 MG/ML IJ SOLN: 12.5000 mg | Freq: Four times a day (QID) | INTRAMUSCULAR | Status: DC | PRN

## 2016-11-21 MED ORDER — MORPHINE SULFATE (PF) 4 MG/ML IV SOLN: 2.0000 mg | INTRAVENOUS | Status: DC | PRN

## 2016-11-21 MED ORDER — DIPHENHYDRAMINE HCL 12.5 MG/5ML PO ELIX: 12.5000 mg | ORAL_SOLUTION | Freq: Four times a day (QID) | ORAL | Status: DC | PRN

## 2016-11-21 MED ORDER — TAMSULOSIN HCL 0.4 MG PO CAPS
0.4000 mg | ORAL_CAPSULE | Freq: Every day | ORAL | Status: DC
  Administered 2016-11-21 – 2016-11-22 (×2): 0.4 mg via ORAL
  Filled 2016-11-21 (×2): qty 1

## 2016-11-21 MED ORDER — SUCCINYLCHOLINE CHLORIDE 200 MG/10ML IV SOSY
PREFILLED_SYRINGE | INTRAVENOUS | Status: AC
  Filled 2016-11-21: qty 10

## 2016-11-21 MED ORDER — HYDROCODONE-ACETAMINOPHEN 5-325 MG PO TABS: 1.0000 | ORAL_TABLET | ORAL | Status: DC | PRN

## 2016-11-21 MED ORDER — SODIUM CHLORIDE 0.9 % IR SOLN
Status: DC | PRN
  Administered 2016-11-21: 3000 mL via INTRAVESICAL
  Administered 2016-11-21: 1000 mL via INTRAVESICAL

## 2016-11-21 MED ORDER — RIVAROXABAN 20 MG PO TABS
20.0000 mg | ORAL_TABLET | Freq: Every day | ORAL | Status: DC
  Administered 2016-11-21 – 2016-11-22 (×2): 20 mg via ORAL
  Filled 2016-11-21 (×2): qty 1

## 2016-11-21 MED ORDER — PROPOFOL 10 MG/ML IV BOLUS
INTRAVENOUS | Status: DC | PRN
  Administered 2016-11-21: 30 mg via INTRAVENOUS
  Administered 2016-11-21: 50 mg via INTRAVENOUS
  Administered 2016-11-21: 130 mg via INTRAVENOUS

## 2016-11-21 MED ORDER — LIDOCAINE 2% (20 MG/ML) 5 ML SYRINGE
INTRAMUSCULAR | Status: DC | PRN
  Administered 2016-11-21 (×2): 50 mg via INTRAVENOUS

## 2016-11-21 MED ORDER — ZOLPIDEM TARTRATE 5 MG PO TABS: 5.0000 mg | ORAL_TABLET | Freq: Every evening | ORAL | Status: DC | PRN

## 2016-11-21 MED ORDER — BELLADONNA ALKALOIDS-OPIUM 16.2-60 MG RE SUPP: 1.0000 | Freq: Four times a day (QID) | RECTAL | Status: DC | PRN

## 2016-11-21 MED ORDER — FENTANYL CITRATE (PF) 100 MCG/2ML IJ SOLN
INTRAMUSCULAR | Status: DC | PRN
  Administered 2016-11-21 (×2): 50 ug via INTRAVENOUS

## 2016-11-21 MED ORDER — CEFAZOLIN SODIUM-DEXTROSE 2-4 GM/100ML-% IV SOLN
2.0000 g | INTRAVENOUS | Status: AC
  Administered 2016-11-21: 2 g via INTRAVENOUS
  Filled 2016-11-21: qty 100

## 2016-11-21 MED ORDER — DEXAMETHASONE SODIUM PHOSPHATE 10 MG/ML IJ SOLN
INTRAMUSCULAR | Status: AC
  Filled 2016-11-21: qty 1

## 2016-11-21 MED ORDER — ONDANSETRON HCL 4 MG/2ML IJ SOLN
INTRAMUSCULAR | Status: DC | PRN
  Administered 2016-11-21: 4 mg via INTRAVENOUS

## 2016-11-21 MED ORDER — ONDANSETRON HCL 4 MG/2ML IJ SOLN: 4.0000 mg | INTRAMUSCULAR | Status: DC | PRN

## 2016-11-21 SURGICAL SUPPLY — 27 items
BAG URINE LEG 500ML (DRAIN) ×2 IMPLANT
BAG URO CATCHER STRL LF (MISCELLANEOUS) ×2 IMPLANT
BASKET ZERO TIP NITINOL 2.4FR (BASKET) ×2 IMPLANT
CATH FOLEY 3WAY 30CC 22FR (CATHETERS) ×2 IMPLANT
CATH INTERMIT  6FR 70CM (CATHETERS) ×2 IMPLANT
CLOTH BEACON ORANGE TIMEOUT ST (SAFETY) ×2 IMPLANT
COVER FOOTSWITCH UNIV (MISCELLANEOUS) IMPLANT
COVER SURGICAL LIGHT HANDLE (MISCELLANEOUS) ×2 IMPLANT
EXTRACTOR STONE NITINOL NGAGE (UROLOGICAL SUPPLIES) ×2 IMPLANT
FIBER LASER FLEXIVA 1000 (UROLOGICAL SUPPLIES) IMPLANT
FIBER LASER FLEXIVA 365 (UROLOGICAL SUPPLIES) IMPLANT
FIBER LASER FLEXIVA 550 (UROLOGICAL SUPPLIES) IMPLANT
FIBER LASER TRAC TIP (UROLOGICAL SUPPLIES) ×2 IMPLANT
GLOVE BIO SURGEON STRL SZ8 (GLOVE) ×2 IMPLANT
GOWN STRL REUS W/TWL XL LVL3 (GOWN DISPOSABLE) ×2 IMPLANT
GUIDEWIRE ANG ZIPWIRE 038X150 (WIRE) ×2 IMPLANT
GUIDEWIRE STR DUAL SENSOR (WIRE) IMPLANT
IV NS 1000ML (IV SOLUTION) ×1
IV NS 1000ML BAXH (IV SOLUTION) ×1 IMPLANT
MANIFOLD NEPTUNE II (INSTRUMENTS) ×2 IMPLANT
PACK CYSTO (CUSTOM PROCEDURE TRAY) ×2 IMPLANT
SHEATH ACCESS URETERAL 38CM (SHEATH) IMPLANT
STENT CONTOUR 6FRX26X.038 (STENTS) IMPLANT
STENT URET 6FRX26 CONTOUR (STENTS) ×2 IMPLANT
SYR 30ML LL (SYRINGE) ×2 IMPLANT
TUBE FEEDING 8FR 16IN STR KANG (MISCELLANEOUS) IMPLANT
TUBING CONNECTING 10 (TUBING) ×2 IMPLANT

## 2016-11-21 NOTE — H&P (Signed)
Urology Admission H&P  Chief Complaint: right flank pain  History of Present Illness: Mr Gorum is an 83yo with a right ureteral calculus who has failed medical expulsive therapy.  Past Medical History:  Diagnosis Date  . Dysrhythmia    atrial fibrillation  . History of kidney stones    Past Surgical History:  Procedure Laterality Date  . HERNIA REPAIR  1974    Home Medications:  Current Facility-Administered Medications  Medication Dose Route Frequency Provider Last Rate Last Dose  . ceFAZolin (ANCEF) IVPB 2g/100 mL premix  2 g Intravenous 30 min Pre-Op Alyson Ingles Candee Furbish, MD      . lactated ringers infusion   Intravenous Continuous Lillia Abed, MD 50 mL/hr at 11/21/16 1145     Allergies: No Known Allergies  Family History  Problem Relation Age of Onset  . Dementia Mother   . Lung cancer Father        smoker  . CAD Father        MI at age 57   Social History:  reports that he quit smoking about 46 years ago. His smoking use included Cigarettes. He has never used smokeless tobacco. He reports that he drinks alcohol. He reports that he does not use drugs.  Review of Systems  All other systems reviewed and are negative.   Physical Exam:  Vital signs in last 24 hours: Temp:  [97.9 F (36.6 C)] 97.9 F (36.6 C) (11/01 1140) Pulse Rate:  [95] 95 (11/01 1140) Resp:  [16] 16 (11/01 1140) BP: (138)/(96) 138/96 (11/01 1140) SpO2:  [98 %] 98 % (11/01 1140) Weight:  [86 kg (189 lb 9.6 oz)] 86 kg (189 lb 9.6 oz) (11/01 1140) Physical Exam  Constitutional: He is oriented to person, place, and time. He appears well-developed and well-nourished.  HENT:  Head: Normocephalic and atraumatic.  Eyes: Pupils are equal, round, and reactive to light. EOM are normal.  Neck: Normal range of motion. No thyromegaly present.  Cardiovascular: Normal rate and regular rhythm.   Respiratory: Effort normal. No respiratory distress.  GI: Soft. He exhibits no distension.  Musculoskeletal:  Normal range of motion. He exhibits no edema.  Neurological: He is alert and oriented to person, place, and time.  Skin: Skin is warm and dry.  Psychiatric: He has a normal mood and affect. His behavior is normal. Judgment and thought content normal.    Laboratory Data:  No results found for this or any previous visit (from the past 24 hour(s)). No results found for this or any previous visit (from the past 240 hour(s)). Creatinine: No results for input(s): CREATININE in the last 168 hours. Baseline Creatinine: unknown  Impression/Assessment:  83yo with a right ureteral calculus  Plan:  The risks/benefits/alternatives to right ureteroscopic stone extraction was explained to the patient and he understands and wishes to proceed with surgery   [name] 11/21/2016, 1:24 PM

## 2016-11-21 NOTE — Anesthesia Postprocedure Evaluation (Signed)
Anesthesia Post Note  Patient: KEDAR SEDANO  Procedure(s) Performed: CYSTOSCOPY WITH RETROGRADE PYELOGRAM, URETEROSCOPY AND STENT PLACEMENT (Right ) HOLMIUM LASER APPLICATION (Right )     Patient location during evaluation: PACU Anesthesia Type: General Level of consciousness: awake and alert Pain management: pain level controlled Vital Signs Assessment: post-procedure vital signs reviewed and stable Respiratory status: spontaneous breathing, nonlabored ventilation, respiratory function stable and patient connected to nasal cannula oxygen Cardiovascular status: blood pressure returned to baseline and stable Postop Assessment: no apparent nausea or vomiting Anesthetic complications: no    Last Vitals:  Vitals:   11/21/16 1642 11/21/16 1653  BP:  (!) 148/97  Pulse: 84   Resp: 12 12  Temp: (!) 36.2 C 36.5 C  SpO2: 100% 97%    Last Pain:  Vitals:   11/21/16 1140  TempSrc: Oral  PainSc: 0-No pain                 Deklyn Trachtenberg DAVID

## 2016-11-21 NOTE — Anesthesia Procedure Notes (Signed)
Procedure Name: Intubation Date/Time: 11/21/2016 2:38 PM Performed by: Deliah Boston Pre-anesthesia Checklist: Patient identified, Emergency Drugs available, Suction available and Patient being monitored Patient Re-evaluated:Patient Re-evaluated prior to induction Oxygen Delivery Method: Circle system utilized Preoxygenation: Pre-oxygenation with 100% oxygen Induction Type: IV induction Ventilation: Mask ventilation without difficulty Laryngoscope Size: Mac and 4 Grade View: Grade I Tube type: Oral Tube size: 7.5 mm Number of attempts: 1 Airway Equipment and Method: Stylet and Oral airway Placement Confirmation: ETT inserted through vocal cords under direct vision,  positive ETCO2 and breath sounds checked- equal and bilateral Secured at: 23 cm Tube secured with: Tape Dental Injury: Teeth and Oropharynx as per pre-operative assessment

## 2016-11-21 NOTE — Brief Op Note (Signed)
11/21/2016  3:48 PM  PATIENT:  Johnny Patel  83 y.o. male  PRE-OPERATIVE DIAGNOSIS:  RIGHT URETERAL CALCULUS  POST-OPERATIVE DIAGNOSIS:  RIGHT URETERAL CALCULUS  PROCEDURE:  Procedure(s): CYSTOSCOPY WITH RETROGRADE PYELOGRAM, URETEROSCOPY AND STENT PLACEMENT (Right) HOLMIUM LASER APPLICATION (Right)  SURGEON:  Surgeon(s) and Role:    * Shauntelle Jamerson, Candee Furbish, MD - Primary  PHYSICIAN ASSISTANT:   ASSISTANTS: none   ANESTHESIA:   general  EBL:  minimal   BLOOD ADMINISTERED:none  DRAINS: Urinary Catheter (Foley) and rigth 6x26 JJ ureteral stent   LOCAL MEDICATIONS USED:  NONE  SPECIMEN:  Source of Specimen:  rigth ureteral calculus  DISPOSITION OF SPECIMEN:  N/A  COUNTS:  YES  TOURNIQUET:  * No tourniquets in log *  DICTATION: .Note written in EPIC  PLAN OF CARE: Admit for overnight observation  PATIENT DISPOSITION:  PACU - hemodynamically stable.   Delay start of Pharmacological VTE agent (>24hrs) due to surgical blood loss or risk of bleeding: not applicable

## 2016-11-21 NOTE — Anesthesia Preprocedure Evaluation (Signed)
Anesthesia Evaluation  Patient identified by MRN, date of birth, ID band Patient awake    Reviewed: Allergy & Precautions, H&P , Patient's Chart, lab work & pertinent test results, reviewed documented beta blocker date and time   Airway Mallampati: II  TM Distance: >3 FB Neck ROM: full    Dental no notable dental hx.    Pulmonary former smoker,    Pulmonary exam normal breath sounds clear to auscultation       Cardiovascular Atrial Fibrillation  Rhythm:regular Rate:Normal     Neuro/Psych    GI/Hepatic   Endo/Other    Renal/GU      Musculoskeletal   Abdominal   Peds  Hematology   Anesthesia Other Findings The cavity size was normal. Wall thickness was increased in a pattern of mild LVH. Systolic function was normal. The estimated ejection fraction was in the range of 55% to 60%.  Reproductive/Obstetrics                             Anesthesia Physical Anesthesia Plan  ASA: II  Anesthesia Plan: General   Post-op Pain Management:    Induction: Intravenous  PONV Risk Score and Plan: 1 and Ondansetron, Dexamethasone and Treatment may vary due to age or medical condition  Airway Management Planned: Oral ETT  Additional Equipment:   Intra-op Plan:   Post-operative Plan: Extubation in OR  Informed Consent: I have reviewed the patients History and Physical, chart, labs and discussed the procedure including the risks, benefits and alternatives for the proposed anesthesia with the patient or authorized representative who has indicated his/her understanding and acceptance.   Dental Advisory Given  Plan Discussed with: CRNA and Surgeon  Anesthesia Plan Comments: (  )        Anesthesia Quick Evaluation

## 2016-11-21 NOTE — Transfer of Care (Signed)
Immediate Anesthesia Transfer of Care Note  Patient: Johnny Patel  Procedure(s) Performed: Procedure(s): CYSTOSCOPY WITH RETROGRADE PYELOGRAM, URETEROSCOPY AND STENT PLACEMENT (Right) HOLMIUM LASER APPLICATION (Right)  Patient Location: PACU  Anesthesia Type:General  Level of Consciousness: Patient easily awoken, sedated, comfortable, cooperative, following commands, responds to stimulation.   Airway & Oxygen Therapy: Patient spontaneously breathing, ventilating well, oxygen via simple oxygen mask.  Post-op Assessment: Report given to PACU RN, vital signs reviewed and stable, moving all extremities.   Post vital signs: Reviewed and stable.  Complications: No apparent anesthesia complications  Last Vitals:  Vitals:   11/21/16 1140 11/21/16 1600  BP: (!) 138/96   Pulse: 95   Resp: 16   Temp: 36.6 C (!) 36.4 C  SpO2: 98%     Last Pain:  Vitals:   11/21/16 1140  TempSrc: Oral  PainSc: 0-No pain      Patients Stated Pain Goal: 4 (50/03/70 4888)  Complications: No apparent anesthesia complications

## 2016-11-22 ENCOUNTER — Encounter (HOSPITAL_COMMUNITY): Payer: Self-pay | Admitting: Urology

## 2016-11-22 DIAGNOSIS — I4891 Unspecified atrial fibrillation: Secondary | ICD-10-CM | POA: Diagnosis not present

## 2016-11-22 DIAGNOSIS — J449 Chronic obstructive pulmonary disease, unspecified: Secondary | ICD-10-CM | POA: Diagnosis not present

## 2016-11-22 DIAGNOSIS — N132 Hydronephrosis with renal and ureteral calculous obstruction: Secondary | ICD-10-CM | POA: Diagnosis not present

## 2016-11-22 DIAGNOSIS — Z87891 Personal history of nicotine dependence: Secondary | ICD-10-CM | POA: Diagnosis not present

## 2016-11-22 LAB — BASIC METABOLIC PANEL
Anion gap: 7 (ref 5–15)
BUN: 16 mg/dL (ref 6–20)
CO2: 27 mmol/L (ref 22–32)
Calcium: 9.1 mg/dL (ref 8.9–10.3)
Chloride: 105 mmol/L (ref 101–111)
Creatinine, Ser: 1.01 mg/dL (ref 0.61–1.24)
GFR calc Af Amer: >60 mL/min (ref >60)
GLUCOSE: 130 mg/dL — AB (ref 65–99)
POTASSIUM: 4.3 mmol/L (ref 3.5–5.1)
SODIUM: 139 mmol/L (ref 135–145)

## 2016-11-22 LAB — CBC
HCT: 39.1 % (ref 39.0–52.0)
Hemoglobin: 13.3 g/dL (ref 13.0–17.0)
MCH: 31.2 pg (ref 26.0–34.0)
MCHC: 34 g/dL (ref 30.0–36.0)
MCV: 91.8 fL (ref 78.0–100.0)
PLATELETS: 282 10*3/uL (ref 150–400)
RBC: 4.26 MIL/uL (ref 4.22–5.81)
RDW: 13.1 % (ref 11.5–15.5)
WBC: 11.4 10*3/uL — AB (ref 4.0–10.5)

## 2016-11-22 NOTE — Progress Notes (Signed)
Pt is active with [facility] and will continue at discharge.

## 2016-11-22 NOTE — Progress Notes (Signed)
Patient given discharge, follow up,  Medication, and foley catheter care/bag  instructions, verbalized understanding, IV removed, personal belongings with patient, family to transport home

## 2016-11-22 NOTE — Discharge Instructions (Signed)
Indwelling Urinary Catheter Care, Adult  Take good care of your catheter to keep it working and to prevent problems.  How to wear your catheter  Attach your catheter to your leg with tape (adhesive tape) or a leg strap. Make sure it is not too tight. If you use tape, remove any bits of tape that are already on the catheter.  How to wear a drainage bag  You should have:   A large overnight bag.   A small leg bag.    Overnight Bag  You may wear the overnight bag at any time. Always keep the bag below the level of your bladder but off the floor. When you sleep, put a clean plastic bag in a wastebasket. Then hang the bag inside the wastebasket.  Leg Bag  Never wear the leg bag at night. Always wear the leg bag below your knee. Keep the leg bag secure with a leg strap or tape.  How to care for your skin   Clean the skin around the catheter at least once every day.   Shower every day. Do not take baths.   Put creams, lotions, or ointments on your genital area only as told by your doctor.   Do not use powders, sprays, or lotions on your genital area.  How to clean your catheter and your skin  1. Wash your hands with soap and water.  2. Wet a washcloth in warm water and gentle (mild) soap.  3. Use the washcloth to clean the skin where the catheter enters your body. Clean downward and wipe away from the catheter in small circles. Do not wipe toward the catheter.  4. Pat the area dry with a clean towel. Make sure to clean off all soap.  How to care for your drainage bags  Empty your drainage bag when it is ?- full or at least 2-3 times a day. Replace your drainage bag once a month or sooner if it starts to smell bad or look dirty. Do not clean your drainage bag unless told by your doctor.  Emptying a drainage bag    Supplies Needed   Rubbing alcohol.   Gauze pad or cotton ball.   Tape or a leg strap.    Steps  1. Wash your hands with soap and water.  2. Separate (detach) the bag from your leg.  3. Hold the bag over  the toilet or a clean container. Keep the bag below your hips and bladder. This stops pee (urine) from going back into the tube.  4. Open the pour spout at the bottom of the bag.  5. Empty the pee into the toilet or container. Do not let the pour spout touch any surface.  6. Put rubbing alcohol on a gauze pad or cotton ball.  7. Use the gauze pad or cotton ball to clean the pour spout.  8. Close the pour spout.  9. Attach the bag to your leg with tape or a leg strap.  10. Wash your hands.    Changing a drainage bag  Supplies Needed   Alcohol wipes.   A clean drainage bag.   Adhesive tape or a leg strap.    Steps  1. Wash your hands with soap and water.  2. Separate the dirty bag from your leg.  3. Pinch the rubber catheter with your fingers so that pee does not spill out.  4. Separate the catheter tube from the drainage tube where these tubes connect (at the   connection valve). Do not let the tubes touch any surface.  5. Clean the end of the catheter tube with an alcohol wipe. Use a different alcohol wipe to clean the end of the drainage tube.  6. Connect the catheter tube to the drainage tube of the clean bag.  7. Attach the new bag to the leg with adhesive tape or a leg strap.  8. Wash your hands.    How to prevent infection and other problems   Never pull on your catheter or try to remove it. Pulling can damage tissue in your body.   Always wash your hands before and after touching your catheter.   If a leg strap gets wet, replace it with a dry one.   Drink enough fluids to keep your pee clear or pale yellow, or as told by your doctor.   Do not let the drainage bag or tubing touch the floor.   Wear cotton underwear.   If you are male, wipe from front to back after you poop (have a bowel movement).   Check on the catheter often to make sure it works and the tubing is not twisted.  Get help if:   Your pee is cloudy.   Your pee smells unusually bad.   Your pee is not draining into the bag.   Your  tube gets clogged.   Your catheter starts to leak.   Your bladder feels full.  Get help right away if:   You have redness, swelling, or pain where the catheter enters your body.   You have fluid, pus, or a bad smell coming from the area where the catheter enters your body.   The area where the catheter enters your body feels warm.   You have a fever.   You have pain in your:  ? Stomach (abdomen).  ? Legs.  ? Lower back.  ? Bladder.   You see blood fill the catheter.   Your pee is pink or red.   You feel sick to your stomach (nauseous).   You throw up (vomit).   You have chills.   Your catheter gets pulled out.  This information is not intended to replace advice given to you by your health care provider. Make sure you discuss any questions you have with your health care provider.  Document Released: 05/04/2012 Document Revised: 12/06/2015 Document Reviewed: 06/22/2013  Elsevier Interactive Patient Education  2018 [employer]

## 2016-11-29 ENCOUNTER — Other Ambulatory Visit: Payer: Self-pay

## 2016-11-29 ENCOUNTER — Encounter (HOSPITAL_COMMUNITY): Payer: Self-pay | Admitting: *Deleted

## 2016-11-29 ENCOUNTER — Emergency Department (HOSPITAL_COMMUNITY)
Admission: EM | Admit: 2016-11-29 | Discharge: 2016-11-29 | Disposition: A | Discharge status: Home / Self Care | Payer: Medicare Other | Attending: Emergency Medicine | Admitting: Emergency Medicine

## 2016-11-29 DIAGNOSIS — Z87891 Personal history of nicotine dependence: Secondary | ICD-10-CM | POA: Diagnosis not present

## 2016-11-29 DIAGNOSIS — Z79899 Other long term (current) drug therapy: Secondary | ICD-10-CM | POA: Diagnosis not present

## 2016-11-29 DIAGNOSIS — R339 Retention of urine, unspecified: Secondary | ICD-10-CM | POA: Insufficient documentation

## 2016-11-29 DIAGNOSIS — Z7901 Long term (current) use of anticoagulants: Secondary | ICD-10-CM | POA: Insufficient documentation

## 2016-11-29 DIAGNOSIS — N201 Calculus of ureter: Secondary | ICD-10-CM | POA: Diagnosis not present

## 2016-11-29 MED ORDER — RIVAROXABAN 20 MG PO TABS: 20.0000 mg | ORAL_TABLET | Freq: Every day | ORAL | 0 refills | Status: DC

## 2016-11-29 NOTE — ED Notes (Signed)
Pt alert & oriented x4, stable gait. Patient  given discharge instructions, paperwork & prescription(s). Patient verbalized understanding. Pt left department w/ no further questions. 

## 2016-11-29 NOTE — Discharge Instructions (Signed)
Keep the Foley catheter in place.  Give urology call for follow-up.  Return for any new or worse symptoms.  Your Xarelto has been renewed.  Prescription provided.

## 2016-11-29 NOTE — ED Provider Notes (Addendum)
[facility] EMERGENCY DEPARTMENT Provider Note   CSN: [id number] Arrival date & time: 11/29/16  2020     History   Chief Complaint Chief Complaint  Patient presents with  . Urinary Retention    HPI Johnny Patel is a 83 y.o. male.  Patient status post removal of a ureteral stent today in the office by Dr. Noah Delaine.  Patient has not been able to void since the stent was removed.  Patient had the stent placed due to a kidney stone.  Patient is on Xarelto for atrial fibrillation.  It appears the stent was on the right side.  Patient here for urinary retention has discomfort in the suprapubic area.  Patient has had difficulty with urinary retention in the past.  Dr. Noland Fordyce office was called by him.  They are aware.      Past Medical History:  Diagnosis Date  . Dysrhythmia    atrial fibrillation  . History of kidney stones     Patient Active Problem List   Diagnosis Date Noted  . Gross hematuria 11/21/2016  . UTI (urinary tract infection) 11/05/2016  . Hyperglycemia 11/05/2016  . AKI (acute kidney injury) ([place]) 11/05/2016  . Obstructive uropathy 11/05/2016  . Acute pyelonephritis 11/05/2016  . Unspecified atrial fibrillation ([place]) 11/05/2016  . Urinary retention   . ARF (acute renal failure) ([place]) 11/04/2016    Past Surgical History:  Procedure Laterality Date  . HERNIA REPAIR  1974       Home Medications    Prior to Admission medications   Medication Sig Start Date End Date Taking? Authorizing Provider  rivaroxaban (XARELTO) 20 MG TABS tablet Take 1 tablet (20 mg total) daily with supper by mouth. 11/29/16   Fredia Sorrow, MD  tamsulosin (FLOMAX) 0.4 MG CAPS capsule Take 1 capsule (0.4 mg total) by mouth daily after supper. 11/07/16   Eber Jones, MD    Family History Family History  Problem Relation Age of Onset  . Dementia Mother   . Lung cancer Father        smoker  . CAD Father        MI at age 24    Social History Social  History   Tobacco Use  . Smoking status: Former Smoker    Types: Cigarettes    Last attempt to quit: 10/23/1970    Years since quitting: 46.1  . Smokeless tobacco: Never Used  Substance Use Topics  . Alcohol use: Yes  . Drug use: No     Allergies   Patient has no known allergies.   Review of Systems Review of Systems  Constitutional: Negative for fever.  HENT: Negative for congestion.   Eyes: Negative for redness.  Respiratory: Negative for shortness of breath.   Cardiovascular: Negative for chest pain.  Gastrointestinal: Positive for abdominal pain. Negative for nausea and vomiting.  Genitourinary: Negative for dysuria.  Musculoskeletal: Negative for back pain.  Skin: Negative for rash.  Neurological: Negative for headaches.  Hematological: Bruises/bleeds easily.  Psychiatric/Behavioral: Negative for confusion.     Physical Exam Updated Vital Signs BP 132/85 (BP Location: Right Arm)   Pulse (!) 105   Temp 98.3 F (36.8 C) (Oral)   Resp 18   Ht 1.829 m (6')   Wt 85.7 kg (189 lb)   SpO2 100%   BMI 25.63 kg/m   Physical Exam  Constitutional: He is oriented to person, place, and time. He appears well-developed and well-nourished. No distress.  HENT:  Head: Normocephalic and  atraumatic.  Mouth/Throat: Oropharynx is clear and moist.  Eyes: Conjunctivae and EOM are normal. Pupils are equal, round, and reactive to light.  Neck: Normal range of motion. Neck supple.  Cardiovascular: Normal rate.  Irregular at times.  Not rapid.  Pulmonary/Chest: Effort normal and breath sounds normal. No respiratory distress.  Abdominal: Soft. Bowel sounds are normal. There is no tenderness.  Musculoskeletal: Normal range of motion.  Neurological: He is alert and oriented to person, place, and time. No cranial nerve deficit. He exhibits normal muscle tone. Coordination normal.  Nursing note and vitals reviewed.    ED Treatments / Results  Labs (all labs ordered are listed, but  only abnormal results are displayed) Labs Reviewed - No data to display  EKG  EKG Interpretation None       Radiology No results found.  Procedures Procedures (including critical care time)  Medications Ordered in ED Medications - No data to display   Initial Impression / Assessment and Plan / ED Course  I have reviewed the triage vital signs and the nursing notes.  Pertinent labs & imaging results that were available during my care of the patient were reviewed by me and considered in my medical decision making (see chart for details).     Patient had Foley catheter placed.  Got about 700 cc of urine out.  Bloody.  Some clots.  But then cleared over time.  Patient also needs a renewal of his Xarelto.  He has a new primary care provider.  And cannot see him until December 9.  He currently has about 1 week left.  So we will provide a prescription for another 30 days.  Patient will follow up with urology and will keep the Foley catheter in place.  Patient provided a leg bag.  Final Clinical Impressions(s) / ED Diagnoses   Final diagnoses:  Urinary retention    ED Discharge Orders        Ordered    rivaroxaban (XARELTO) 20 MG TABS tablet  Daily with supper     11/29/16 2134       Fredia Sorrow, MD 11/29/16 8295    Fredia Sorrow, MD 11/29/16 2145

## 2016-11-29 NOTE — ED Triage Notes (Addendum)
Pt had a stent removed at his urologist's office today. Dr. Alyson Ingles. Pt has not been able to urinate since and was told by Dr. Alyson Ingles that if he had not urinated by 8pm, to come to the emergency room to get a catheter.

## 2016-11-30 NOTE — Discharge Summary (Signed)
Physician Discharge Summary  Patient ID: Johnny Patel MRN: [id number] DOB/AGE: Feb 22, 1931 83 y.o.  Admit date: 11/21/2016 Discharge date: 11/22/2016  Admission Diagnoses: Gross heamturia Discharge Diagnoses:  Active Problems:   Gross hematuria   Discharged Condition: good  Hospital Course: The patient tolerated the procedure well and was transferred to the floor on IV pain meds, IV fluid. On POD#1  pt was started on a regular diet and they ambulated in the halls.  Prior to discharge the pt was tolerating a regular diet, pain was controlled on PO pain meds, they were ambulating without difficulty, and they had normal bowel function.   Consults: None  Significant Diagnostic Studies: none  Treatments: surgery: ureteroscopic stone extraction  Discharge Exam: Blood pressure 100/71, pulse 73, temperature 97.9 F (36.6 C), temperature source Oral, resp. rate 16, height 6' (1.829 m), weight 86 kg (189 lb 9.6 oz), SpO2 98 %. General appearance: alert, cooperative and appears stated age Head: Normocephalic, without obvious abnormality, atraumatic Nose: Nares normal. Septum midline. Mucosa normal. No drainage or sinus tenderness. Neck: no adenopathy, no carotid bruit, no JVD, supple, symmetrical, trachea midline and thyroid not enlarged, symmetric, no tenderness/mass/nodules Resp: clear to auscultation bilaterally Cardio: regular rate and rhythm, S1, S2 normal, no murmur, click, rub or gallop GI: soft, non-tender; bowel sounds normal; no masses,  no organomegaly Extremities: extremities normal, atraumatic, no cyanosis or edema Neurologic: Grossly normal  Disposition: 01-Home or Self Care  Discharge Instructions    Discharge patient   Complete by:  As directed    Discharge disposition:  01-Home or Self Care   Discharge patient date:  11/22/2016     Allergies as of 11/22/2016   No Known Allergies     Medication List    TAKE these medications   tamsulosin 0.4 MG Caps  capsule Commonly known as:  FLOMAX Take 1 capsule (0.4 mg total) by mouth daily after supper.      Follow-up Information    Tandrea Kommer, Candee Furbish, MD. Call on 11/29/2016.   Specialty:  Urology Contact information: [address] [place] [address] [phone]           Signed: [name] 11/30/2016, 1:37 PM

## 2016-11-30 NOTE — Op Note (Signed)
Preoperative diagnosis: Right ureteral stone  Postoperative diagnosis: Same  Procedure: 1 cystoscopy 2 right retrograde pyelography 3.  Intraoperative fluoroscopy, under one hour, with interpretation 4.  Right ureteroscopic stone manipulation with laser lithotripsy 5.  Right 6 x 26 JJ stent placement  Attending: Rosie Fate  Anesthesia: General  Estimated blood loss: None  Drains: Right 6 x 26 JJ ureteral stent without tether 22 french foley  Specimens: stone for analysis  Antibiotics: ancef  Findings: right distal ureteral calculus with moderate hydronephrosis. Hematuria following stone extraction requiring continuous bladder irrigation.  Indications: Patient is a 83 year old male/male with a history of ureteral stone and who has failed medical expulsive therapy.  After discussing treatment options, she decided proceed with right ureteroscopic stone manipulation.  Procedure her in detail: The patient was brought to the operating room and a brief timeout was done to ensure correct patient, correct procedure, correct site.  General anesthesia was administered patient was placed in dorsal lithotomy position.  Her genitalia was then prepped and draped in usual sterile fashion.  A rigid 14 French cystoscope was passed in the urethra and the bladder.  Bladder was inspected free masses or lesions.  the right ureteral orifices were in the normal orthotopic locations.  a 6 french ureteral catheter was then instilled into the right ureter orifice.  a gentle retrograde was obtained and findings noted above.  we then placed a zip wire through the ureteral catheter and advanced up to the renal pelvis.  we then removed the cystoscope and cannulated the right ureteral orifice with a semirigid ureteroscope.  we then encountered the stone in the distal ureter.  using using a 200 nm laser fiber and fragmented the stone into smaller pieces.  the pieces were then removed with a engage basket.    once  all stone fragments were removed we then placed a 6 x 26 double-j ureteral stent over the original zip wire.  We then removed the wire and good coil was noted in the the renal pelvis under fluoroscopy and the bladder under direct vision. the stone fragments were then removed from the bladder and sent for analysis.   the bladder was then drained, a 22 french foley was placed and this concluded the procedure which was well tolerated by patient.  Complications: None  Condition: Stable, extubated, transferred to PACU  Plan: Patient is to be admitted for continuous bladder irrigation

## 2016-12-09 DIAGNOSIS — N201 Calculus of ureter: Secondary | ICD-10-CM | POA: Diagnosis not present

## 2016-12-09 DIAGNOSIS — R338 Other retention of urine: Secondary | ICD-10-CM | POA: Diagnosis not present

## 2016-12-26 ENCOUNTER — Other Ambulatory Visit: Payer: Self-pay

## 2016-12-26 ENCOUNTER — Inpatient Hospital Stay (HOSPITAL_COMMUNITY): Payer: Medicare Other | Admitting: Certified Registered Nurse Anesthetist

## 2016-12-26 ENCOUNTER — Other Ambulatory Visit: Payer: Self-pay | Admitting: Urology

## 2016-12-26 ENCOUNTER — Encounter (HOSPITAL_COMMUNITY)
Admission: AD | Disposition: A | Discharge status: Home / Self Care | Payer: Self-pay | Source: Ambulatory Visit | Attending: Urology

## 2016-12-26 ENCOUNTER — Encounter (HOSPITAL_COMMUNITY): Payer: Self-pay | Admitting: *Deleted

## 2016-12-26 ENCOUNTER — Inpatient Hospital Stay (HOSPITAL_COMMUNITY)
Admission: AD | Admit: 2016-12-26 | Discharge: 2016-12-28 | DRG: 712 | Disposition: A | Discharge status: Home / Self Care | Payer: Medicare Other | Source: Ambulatory Visit | Attending: Urology | Admitting: Urology

## 2016-12-26 DIAGNOSIS — N453 Epididymo-orchitis: Secondary | ICD-10-CM | POA: Diagnosis not present

## 2016-12-26 DIAGNOSIS — Z7901 Long term (current) use of anticoagulants: Secondary | ICD-10-CM | POA: Diagnosis not present

## 2016-12-26 DIAGNOSIS — Z87891 Personal history of nicotine dependence: Secondary | ICD-10-CM | POA: Diagnosis not present

## 2016-12-26 DIAGNOSIS — N492 Inflammatory disorders of scrotum: Principal | ICD-10-CM | POA: Diagnosis present

## 2016-12-26 DIAGNOSIS — Z8249 Family history of ischemic heart disease and other diseases of the circulatory system: Secondary | ICD-10-CM

## 2016-12-26 DIAGNOSIS — N451 Epididymitis: Secondary | ICD-10-CM | POA: Diagnosis not present

## 2016-12-26 DIAGNOSIS — N499 Inflammatory disorder of unspecified male genital organ: Secondary | ICD-10-CM | POA: Diagnosis not present

## 2016-12-26 DIAGNOSIS — N390 Urinary tract infection, site not specified: Secondary | ICD-10-CM | POA: Diagnosis not present

## 2016-12-26 DIAGNOSIS — I4891 Unspecified atrial fibrillation: Secondary | ICD-10-CM | POA: Diagnosis present

## 2016-12-26 DIAGNOSIS — N201 Calculus of ureter: Secondary | ICD-10-CM | POA: Diagnosis not present

## 2016-12-26 DIAGNOSIS — N179 Acute kidney failure, unspecified: Secondary | ICD-10-CM | POA: Diagnosis not present

## 2016-12-26 HISTORY — PX: IRRIGATION AND DEBRIDEMENT ABSCESS: SHX5252

## 2016-12-26 HISTORY — PX: ORCHIECTOMY: SHX2116

## 2016-12-26 LAB — PROTIME-INR
INR: 2.03
PROTHROMBIN TIME: 22.8 s — AB (ref 11.4–15.2)

## 2016-12-26 LAB — CREATININE, SERUM: CREATININE: 1.02 mg/dL (ref 0.61–1.24)

## 2016-12-26 LAB — HEMOGLOBIN: HEMOGLOBIN: 13.4 g/dL (ref 13.0–17.0)

## 2016-12-26 SURGERY — IRRIGATION AND DEBRIDEMENT ABSCESS
Anesthesia: General | Site: Scrotum | Laterality: Right

## 2016-12-26 MED ORDER — ACETAMINOPHEN 325 MG PO TABS
650.0000 mg | ORAL_TABLET | ORAL | Status: DC | PRN
  Administered 2016-12-28 (×2): 650 mg via ORAL
  Filled 2016-12-26 (×2): qty 2

## 2016-12-26 MED ORDER — DOCUSATE SODIUM 100 MG PO CAPS
100.0000 mg | ORAL_CAPSULE | Freq: Two times a day (BID) | ORAL | Status: DC
  Administered 2016-12-26 – 2016-12-27 (×3): 100 mg via ORAL
  Filled 2016-12-26 (×4): qty 1

## 2016-12-26 MED ORDER — ONDANSETRON HCL 4 MG/2ML IJ SOLN
INTRAMUSCULAR | Status: AC
  Filled 2016-12-26: qty 2

## 2016-12-26 MED ORDER — 0.9 % SODIUM CHLORIDE (POUR BTL) OPTIME
TOPICAL | Status: DC | PRN
  Administered 2016-12-26: 1000 mL

## 2016-12-26 MED ORDER — PIPERACILLIN-TAZOBACTAM 3.375 G IVPB
3.3750 g | Freq: Three times a day (TID) | INTRAVENOUS | Status: DC
  Administered 2016-12-26 – 2016-12-27 (×2): 3.375 g via INTRAVENOUS
  Filled 2016-12-26 (×2): qty 50

## 2016-12-26 MED ORDER — SUCCINYLCHOLINE CHLORIDE 200 MG/10ML IV SOSY
PREFILLED_SYRINGE | INTRAVENOUS | Status: DC | PRN
  Administered 2016-12-26: 100 mg via INTRAVENOUS

## 2016-12-26 MED ORDER — LIDOCAINE 2% (20 MG/ML) 5 ML SYRINGE
INTRAMUSCULAR | Status: AC
  Filled 2016-12-26: qty 5

## 2016-12-26 MED ORDER — LACTATED RINGERS IV SOLN
INTRAVENOUS | Status: DC
  Administered 2016-12-26: 17:00:00 via INTRAVENOUS

## 2016-12-26 MED ORDER — PROPOFOL 10 MG/ML IV BOLUS
INTRAVENOUS | Status: AC
  Filled 2016-12-26: qty 20

## 2016-12-26 MED ORDER — VANCOMYCIN HCL 10 G IV SOLR
1500.0000 mg | INTRAVENOUS | Status: DC
  Filled 2016-12-26: qty 1500

## 2016-12-26 MED ORDER — VANCOMYCIN HCL 10 G IV SOLR
1500.0000 mg | INTRAVENOUS | Status: AC
  Administered 2016-12-26: 1500 mg via INTRAVENOUS
  Filled 2016-12-26: qty 1500

## 2016-12-26 MED ORDER — ALFUZOSIN HCL ER 10 MG PO TB24
10.0000 mg | ORAL_TABLET | Freq: Every day | ORAL | Status: DC
  Filled 2016-12-26: qty 1

## 2016-12-26 MED ORDER — ENSURE ENLIVE PO LIQD
237.0000 mL | Freq: Two times a day (BID) | ORAL | Status: DC
  Administered 2016-12-27 (×2): 237 mL via ORAL

## 2016-12-26 MED ORDER — BUPIVACAINE HCL (PF) 0.25 % IJ SOLN
INTRAMUSCULAR | Status: AC
  Filled 2016-12-26: qty 30

## 2016-12-26 MED ORDER — DEXAMETHASONE SODIUM PHOSPHATE 10 MG/ML IJ SOLN
INTRAMUSCULAR | Status: AC
  Filled 2016-12-26: qty 1

## 2016-12-26 MED ORDER — ONDANSETRON HCL 4 MG/2ML IJ SOLN
INTRAMUSCULAR | Status: DC | PRN
  Administered 2016-12-26: 4 mg via INTRAVENOUS

## 2016-12-26 MED ORDER — DEXAMETHASONE SODIUM PHOSPHATE 10 MG/ML IJ SOLN
INTRAMUSCULAR | Status: DC | PRN
  Administered 2016-12-26: 10 mg via INTRAVENOUS

## 2016-12-26 MED ORDER — OXYCODONE HCL 5 MG PO TABS: 5.0000 mg | ORAL_TABLET | ORAL | Status: DC | PRN

## 2016-12-26 MED ORDER — MORPHINE SULFATE (PF) 2 MG/ML IV SOLN: 2.0000 mg | INTRAVENOUS | Status: DC | PRN

## 2016-12-26 MED ORDER — DIPHENHYDRAMINE HCL 50 MG/ML IJ SOLN: 12.5000 mg | Freq: Four times a day (QID) | INTRAMUSCULAR | Status: DC | PRN

## 2016-12-26 MED ORDER — ESMOLOL HCL 100 MG/10ML IV SOLN
INTRAVENOUS | Status: DC | PRN
  Administered 2016-12-26: 40 mg via INTRAVENOUS
  Administered 2016-12-26: 30 mg via INTRAVENOUS

## 2016-12-26 MED ORDER — FENTANYL CITRATE (PF) 100 MCG/2ML IJ SOLN
INTRAMUSCULAR | Status: AC
  Filled 2016-12-26: qty 2

## 2016-12-26 MED ORDER — LIDOCAINE 2% (20 MG/ML) 5 ML SYRINGE
INTRAMUSCULAR | Status: DC | PRN
  Administered 2016-12-26: 75 mg via INTRAVENOUS

## 2016-12-26 MED ORDER — PROPOFOL 10 MG/ML IV BOLUS
INTRAVENOUS | Status: DC | PRN
  Administered 2016-12-26: 50 mg via INTRAVENOUS
  Administered 2016-12-26: 150 mg via INTRAVENOUS

## 2016-12-26 MED ORDER — PROMETHAZINE HCL 25 MG/ML IJ SOLN: 6.2500 mg | INTRAMUSCULAR | Status: DC | PRN

## 2016-12-26 MED ORDER — DIPHENHYDRAMINE HCL 12.5 MG/5ML PO ELIX: 12.5000 mg | ORAL_SOLUTION | Freq: Four times a day (QID) | ORAL | Status: DC | PRN

## 2016-12-26 MED ORDER — FENTANYL CITRATE (PF) 100 MCG/2ML IJ SOLN: 25.0000 ug | INTRAMUSCULAR | Status: DC | PRN

## 2016-12-26 MED ORDER — ONDANSETRON HCL 4 MG/2ML IJ SOLN
4.0000 mg | INTRAMUSCULAR | Status: DC | PRN
  Administered 2016-12-28: 4 mg via INTRAVENOUS
  Filled 2016-12-26: qty 2

## 2016-12-26 MED ORDER — FENTANYL CITRATE (PF) 250 MCG/5ML IJ SOLN
INTRAMUSCULAR | Status: DC | PRN
  Administered 2016-12-26: 100 ug via INTRAVENOUS
  Administered 2016-12-26 (×2): 50 ug via INTRAVENOUS

## 2016-12-26 MED ORDER — ENOXAPARIN SODIUM 40 MG/0.4ML ~~LOC~~ SOLN
40.0000 mg | SUBCUTANEOUS | Status: DC
  Administered 2016-12-27 – 2016-12-28 (×2): 40 mg via SUBCUTANEOUS
  Filled 2016-12-26 (×2): qty 0.4

## 2016-12-26 MED ORDER — PIPERACILLIN-TAZOBACTAM 3.375 G IVPB 30 MIN
3.3750 g | INTRAVENOUS | Status: AC
  Administered 2016-12-26: 3.375 g via INTRAVENOUS
  Filled 2016-12-26 (×2): qty 50

## 2016-12-26 MED ORDER — SODIUM CHLORIDE 0.9 % IV SOLN
INTRAVENOUS | Status: DC
  Administered 2016-12-26: 20:00:00 via INTRAVENOUS
  Administered 2016-12-27: 1000 mL via INTRAVENOUS

## 2016-12-26 MED ORDER — ESMOLOL HCL 100 MG/10ML IV SOLN
INTRAVENOUS | Status: AC
  Filled 2016-12-26: qty 10

## 2016-12-26 SURGICAL SUPPLY — 53 items
BAG URINE DRAINAGE (UROLOGICAL SUPPLIES) ×4 IMPLANT
BENZOIN TINCTURE PRP APPL 2/3 (GAUZE/BANDAGES/DRESSINGS) IMPLANT
BLADE HEX COATED 2.75 (ELECTRODE) ×8 IMPLANT
BLADE SURG 15 STRL LF DISP TIS (BLADE) ×4 IMPLANT
BLADE SURG 15 STRL SS (BLADE) ×4
BLADE SURG SZ10 CARB STEEL (BLADE) ×8 IMPLANT
BNDG GAUZE ELAST 4 BULKY (GAUZE/BANDAGES/DRESSINGS) ×4 IMPLANT
CATH FOLEY 2WAY SLVR  5CC 16FR (CATHETERS) ×2
CATH FOLEY 2WAY SLVR 5CC 16FR (CATHETERS) ×2 IMPLANT
CLOSURE WOUND 1/2 X4 (GAUZE/BANDAGES/DRESSINGS)
COVER SURGICAL LIGHT HANDLE (MISCELLANEOUS) ×4 IMPLANT
DECANTER SPIKE VIAL GLASS SM (MISCELLANEOUS) IMPLANT
DRAIN PENROSE 18X1/4 LTX STRL (WOUND CARE) ×4 IMPLANT
DRAPE LAPAROSCOPIC ABDOMINAL (DRAPES) IMPLANT
DRAPE LAPAROTOMY T 102X78X121 (DRAPES) IMPLANT
DRAPE LAPAROTOMY TRNSV 102X78 (DRAPE) IMPLANT
DRAPE POUCH INSTRU U-SHP 10X18 (DRAPES) ×4 IMPLANT
DRAPE SHEET LG 3/4 BI-LAMINATE (DRAPES) IMPLANT
ELECT PENCIL ROCKER SW 15FT (MISCELLANEOUS) ×8 IMPLANT
ELECT REM PT RETURN 15FT ADLT (MISCELLANEOUS) ×8 IMPLANT
EVACUATOR SILICONE 100CC (DRAIN) IMPLANT
GAUZE SPONGE 4X4 12PLY STRL (GAUZE/BANDAGES/DRESSINGS) ×8 IMPLANT
GLOVE BIOGEL M STRL SZ7.5 (GLOVE) ×4 IMPLANT
GLOVE BIOGEL PI IND STRL 7.0 (GLOVE) ×2 IMPLANT
GLOVE BIOGEL PI INDICATOR 7.0 (GLOVE) ×2
GLOVE EUDERMIC 7 POWDERFREE (GLOVE) ×8 IMPLANT
GOWN STRL REUS W/TWL LRG LVL3 (GOWN DISPOSABLE) ×4 IMPLANT
GOWN STRL REUS W/TWL XL LVL3 (GOWN DISPOSABLE) ×8 IMPLANT
KIT BASIN OR (CUSTOM PROCEDURE TRAY) ×8 IMPLANT
NEEDLE HYPO 22GX1.5 SAFETY (NEEDLE) IMPLANT
NEEDLE HYPO 25X1 1.5 SAFETY (NEEDLE) ×8 IMPLANT
NS IRRIG 1000ML POUR BTL (IV SOLUTION) ×8 IMPLANT
PACK BASIC VI WITH GOWN DISP (CUSTOM PROCEDURE TRAY) ×8 IMPLANT
PACK GENERAL/GYN (CUSTOM PROCEDURE TRAY) ×4 IMPLANT
SOL PREP POV-IOD 4OZ 10% (MISCELLANEOUS) ×4 IMPLANT
SPONGE LAP 18X18 X RAY DECT (DISPOSABLE) ×4 IMPLANT
SPONGE LAP 4X18 X RAY DECT (DISPOSABLE) ×8 IMPLANT
STAPLER VISISTAT 35W (STAPLE) IMPLANT
STRIP CLOSURE SKIN 1/2X4 (GAUZE/BANDAGES/DRESSINGS) IMPLANT
SUPPORT SCROTAL LG STRP (MISCELLANEOUS) ×3 IMPLANT
SUPPORTER ATHLETIC LG (MISCELLANEOUS) ×1
SUT CHROMIC 3 0 SH 27 (SUTURE) ×8 IMPLANT
SUT MNCRL AB 4-0 PS2 18 (SUTURE) IMPLANT
SUT SILK 0 (SUTURE) ×2
SUT SILK 0 30XBRD TIE 6 (SUTURE) ×2 IMPLANT
SUT SILK 0 SH 30 (SUTURE) ×4 IMPLANT
SUT VIC AB 2-0 UR5 27 (SUTURE) IMPLANT
SUT VIC AB 3-0 SH 18 (SUTURE) IMPLANT
SUT VICRYL 0 TIES 12 18 (SUTURE) IMPLANT
SYR CONTROL 10ML LL (SYRINGE) ×8 IMPLANT
TOWEL OR 17X26 10 PK STRL BLUE (TOWEL DISPOSABLE) ×8 IMPLANT
WATER STERILE IRR 1000ML POUR (IV SOLUTION) ×4 IMPLANT
YANKAUER SUCT BULB TIP 10FT TU (MISCELLANEOUS) IMPLANT

## 2016-12-26 NOTE — Transfer of Care (Signed)
Immediate Anesthesia Transfer of Care Note  Patient: Johnny Patel  Procedure(s) Performed: RIGHT SCROTAL EXPLORATION, RIGHT ORCHIECTOMY (Right Scrotum) POSSIBLE RIGHT ORCHIECTOMY (Right )  Patient Location: PACU  Anesthesia Type:General  Level of Consciousness: awake, drowsy and responds to stimulation  Airway & Oxygen Therapy: Patient Spontanous Breathing and Patient connected to face mask oxygen  Post-op Assessment: Report given to RN and Post -op Vital signs reviewed and stable  Post vital signs: Reviewed and stable  Last Vitals:  Vitals:   12/26/16 1632  BP: (!) 137/91  Pulse: (!) 117  Resp: 20  Temp: 36.7 C  SpO2: 95%    Last Pain:  Vitals:   12/26/16 1632  TempSrc: Oral         Complications: No apparent anesthesia complications

## 2016-12-26 NOTE — Progress Notes (Signed)
Pt presents with foley pulled tight in statlock holder on right upper inner thigh.  States he has not changed holding position of catheter draped across scrotum since insertion.  Bloody drainage to scrotal area and sore across right scrotum. States he did not know how to change bag because he could not "figure out how to get it apart." Wife at bedside very supportive.  Teaching started for foley care , will need thorough teaching prior to discharge. Foley bag and tubing with sediment and dark drainage.  Dr Gloriann Loan paged and has plan to change out bag/foley in OR.

## 2016-12-26 NOTE — Anesthesia Postprocedure Evaluation (Signed)
Anesthesia Post Note  Patient: Johnny Patel  Procedure(s) Performed: RIGHT SCROTAL EXPLORATION, RIGHT ORCHIECTOMY (Right Scrotum) POSSIBLE RIGHT ORCHIECTOMY (Right )     Patient location during evaluation: PACU Anesthesia Type: General Level of consciousness: sedated Pain management: pain level controlled Vital Signs Assessment: post-procedure vital signs reviewed and stable Respiratory status: spontaneous breathing and respiratory function stable Cardiovascular status: stable Postop Assessment: no apparent nausea or vomiting Anesthetic complications: no    Last Vitals:  Vitals:   12/26/16 1945 12/26/16 2000  BP: 119/87 110/84  Pulse: 97 100  Resp: 18 17  Temp:  37.1 C  SpO2: 94% 94%    Last Pain:  Vitals:   12/26/16 1632  TempSrc: Oral                 Samar Venneman DANIEL

## 2016-12-26 NOTE — Anesthesia Procedure Notes (Signed)
Procedure Name: Intubation Date/Time: 12/26/2016 5:51 PM Performed by: Lollie Sails, CRNA Pre-anesthesia Checklist: Patient identified, Emergency Drugs available, Suction available, Patient being monitored and Timeout performed Patient Re-evaluated:Patient Re-evaluated prior to induction Oxygen Delivery Method: Circle system utilized Preoxygenation: Pre-oxygenation with 100% oxygen Induction Type: IV induction, Rapid sequence and Cricoid Pressure applied Laryngoscope Size: Mac and 4 Grade View: Grade I Tube type: Oral Tube size: 7.5 mm Number of attempts: 1 Airway Equipment and Method: Stylet Placement Confirmation: ETT inserted through vocal cords under direct vision,  positive ETCO2 and breath sounds checked- equal and bilateral Secured at: 23 cm Tube secured with: Tape Dental Injury: Teeth and Oropharynx as per pre-operative assessment

## 2016-12-26 NOTE — Progress Notes (Signed)
Pharmacy Antibiotic Note  Johnny Patel is a 83 y.o. male admitted on 12/26/2016 with severe epididymal orchitis with scrotal cellulitis.  Pharmacy has been consulted for vancomycin dosing.  Plan: Vancomycin 1500mg  IV x 1 pre-op, continue 1500mg  q24h (AUC 509, Scr 1.0) Zosyn 3.375mg  IV q8h EI per MD Daily Scr Follow renal function, cultures and clinical course  Height: 6' (182.9 cm) Weight: 180 lb 6.4 oz (81.8 kg) IBW/kg (Calculated) : 77.6  Temp (24hrs), Avg:98.4 F (36.9 C), Min:98 F (36.7 C), Max:98.9 F (37.2 C)  No results for input(s): WBC, CREATININE, LATICACIDVEN, VANCOTROUGH, VANCOPEAK, VANCORANDOM, GENTTROUGH, GENTPEAK, GENTRANDOM, TOBRATROUGH, TOBRAPEAK, TOBRARND, AMIKACINPEAK, AMIKACINTROU, AMIKACIN in the last 168 hours.  CrCl cannot be calculated (Patient's most recent lab result is older than the maximum 21 days allowed.).    No Known Allergies  Antimicrobials this admission: 12/6 vancomycin >> 12/6 zosyn >>   Thank you for allowing pharmacy to be a part of this patient's care.  Dolly Rias RPh 12/26/2016, 8:55 PM Pager [phone]

## 2016-12-26 NOTE — Op Note (Signed)
Operative Note  Preoperative diagnosis:  1.  Scrotal abscess  Postoperative diagnosis: 1.  Severe epididymal orchitis with scrotal cellulitis  Procedure(s): 1.  Scrotal exploration with right simple orchiectomy  Surgeon: Link Snuffer, MD  Assistants: None  Anesthesia: General  Complications: None immediate  EBL: 20 cc  Specimens: 1.  Right testicle  Drains/Catheters: 1.  Penrose drain 2.  Foley catheter  Intraoperative findings: There is initially crepitus on exam.  After exploring the scrotum there was no evidence of gangrenous infection.  All tissue appeared viable but was inflamed.  The right testicle itself was severely abnormal, firm, and indurated.  Indication: 83 year old male who presented to the urology clinic today for urodynamics mentioned that he had some right-sided testicular pain.  He was examined and found to have crepitus and fullness in the right scrotum.  Scrotal ultrasound confirmed air within the scrotal tissue as well as evidence of severe epididymal orchitis.  Given this finding the decision was made to proceed emergently for exploration given the concern for possible Fournier's gangrene.  Description of procedure:  The patient was identified and consent was obtained.  The patient was taken to the operating room and placed in the supine position.  The patient was placed under general anesthesia.  Perioperative antibiotics were administered.  The patient was placed in dorsal lithotomy.  Patient was prepped and draped in a standard sterile fashion and a timeout was performed.  Bovie on cut setting was used to make a 4 cm incision in the right hemiscrotum longitudinally.  This was taken down with electrocautery down to the level of the testicle which was delivered onto the operative field.  The scrotal cavity was completely inspected and all gubernacular attachments were freed.  The  testicle  was thoroughly inspected.  No this was found to be firm, indurated,  and edematous.  There was evidence of severe infection.  Given this finding, the decision was made to proceed with simple orchiectomy on the right.  The vas deferens was separated from the remainder of the cord and a clamp placed around it.  2 separate Kelly clamps were placed around the remainder of the cord.  The testicle was excised and passed for specimen.  0 silk tie was used to tie proximal to the hemostat clamp on the vas deferens which was then released.  A stick tie was used under the distal most Claiborne Billings and this was suture ligated.  This Claiborne Billings was released.  A second 0 silk tie was used to tie just proximal to the other Kelly clamp.  The clamp was released.  I thoroughly inspected the stump and there was no evidence of any bleeding from here.  I then inspected the scrotal cavity.  Again there was no evidence of any gangrenous tissue.  There was no pus.  There was no abscess.  There was evidence of inflammation within the scrotum itself.  All tissue appeared to be viable and bleeding.  Hemostasis was achieved with electrocautery.  I left a Penrose drain which came out the inferior portion of the scrotum.  I then closed the dartos with a running 3-0 chromic followed by closure of the skin with a running 3-0 chromic.  I anesthetized the area with quarter percent Marcaine without epinephrine.  A Foley catheter was then placed.  This concluded the operation.  Patient tolerated the procedure well and was stable postoperatively.  Plan: I will admit the patient.  I will continue vancomycin and Zosyn and as long as he  does well change him over to a p.o. regimen possibly tomorrow. He will continue his Foley catheter.

## 2016-12-26 NOTE — Interval H&P Note (Signed)
History and Physical Interval Note:  12/26/2016 5:09 PM  [place] Calix DEWIGHT CATINO  has presented today for surgery, with the diagnosis of right scrotal abscess  The various methods of treatment have been discussed with the patient and family. After consideration of risks, benefits and other options for treatment, the patient has consented to  Procedure(s): IRRIGATION AND DEBRIDEMENT RIGHT SCROTAL ABSCESS, POSSIBLE EXCISION SCROTAL TISSUE (Right) POSSIBLE RIGHT ORCHIECTOMY (Right) as a surgical intervention .  The patient's history has been reviewed, patient examined, no change in status, stable for surgery.  I have reviewed the patient's chart and labs.  Questions were answered to the patient's satisfaction.     Marton Redwood, III

## 2016-12-26 NOTE — H&P (Signed)
H&P  Chief Complaint: scrotal abscess  History of Present Illness: 83 year old male on Xarelto presented to our clinic with right-sided testicular pain.  He has a indwelling catheter and was going to have a urodynamics.  He was evaluated and found to have crepitus in the right scrotum.  Ultrasound confirmed this as well as severe epididymal orchitis with possible abscess formation.  He is therefore admitted to the hospital to urgently undergo incision and drainage of the scrotum with possible right orchiectomy, possible scrotal debridement.  Past Medical History:  Diagnosis Date  . Dysrhythmia    atrial fibrillation  . History of kidney stones    Past Surgical History:  Procedure Laterality Date  . CYSTOSCOPY WITH RETROGRADE PYELOGRAM, URETEROSCOPY AND STENT PLACEMENT Right 11/21/2016   Procedure: CYSTOSCOPY WITH RETROGRADE PYELOGRAM, URETEROSCOPY AND STENT PLACEMENT;  Surgeon: Cleon Gustin, MD;  Location: [facility];  Service: Urology;  Laterality: Right;  . HERNIA REPAIR  1974  . HOLMIUM LASER APPLICATION Right 81/01/9145   Procedure: HOLMIUM LASER APPLICATION;  Surgeon: Cleon Gustin, MD;  Location: [facility];  Service: Urology;  Laterality: Right;    Home Medications:  Medications Prior to Admission  Medication Sig Dispense Refill Last Dose  . alfuzosin (UROXATRAL) 10 MG 24 hr tablet Take 10 mg by mouth daily with breakfast.   Past Week at Unknown time  . Cholecalciferol (VITAMIN D PO) Take 1 tablet by mouth daily.   Past Week at Unknown time  . rivaroxaban (XARELTO) 20 MG TABS tablet Take 1 tablet (20 mg total) daily with supper by mouth. 30 tablet 0 12/25/2016 at 800 PM  . tamsulosin (FLOMAX) 0.4 MG CAPS capsule Take 1 capsule (0.4 mg total) by mouth daily after supper. 30 capsule 0 Past Month at Unknown time   Allergies: No Known Allergies  Family History  Problem Relation Age of Onset  . Dementia Mother   . Lung cancer Father        smoker  . CAD Father        MI at  age 42   Social History:  reports that he quit smoking about 46 years ago. His smoking use included cigarettes. he has never used smokeless tobacco. He reports that he drinks alcohol. He reports that he does not use drugs.  ROS: A complete review of systems was performed.  All systems are negative except for pertinent findings as noted. ROS   Physical Exam:  Vital signs in last 24 hours: Temp:  [98 F (36.7 C)] 98 F (36.7 C) (12/06 1632) Pulse Rate:  [117] 117 (12/06 1632) Resp:  [20] 20 (12/06 1632) BP: (137)/(91) 137/91 (12/06 1632) SpO2:  [95 %] 95 % (12/06 1632) Weight:  [81.8 kg (180 lb 6.4 oz)] 81.8 kg (180 lb 6.4 oz) (12/06 1632)  General:  Alert and oriented, No acute distress HEENT: Normocephalic, atraumatic Cardiovascular: Adequate peripheral perfusion Lungs: Regular rate and effort Abdomen: Soft, nontender, nondistended, no abdominal masses Back: No CVA tenderness Extremities: No edema Genitourinary: Foley catheter in place.  Right hemiscrotum swollen and tender to palpation.  It is erythematous.  The superior portion has a small amount of crepitus. Neurologic: Grossly intact  Laboratory Data:  No results found for this or any previous visit (from the past 24 hour(s)). No results found for this or any previous visit (from the past 240 hour(s)). Creatinine: No results for input(s): CREATININE in the last 168 hours.  Impression/Assessment:  Right scrotal abscess with possible Fournier's gangrene  Plan:  Proceed emergently to the operating room for scrotal exploration, incision and drainage, possible right orchiectomy, possible scrotal debridement.  Marton Redwood, III 12/26/2016, 5:05 PM

## 2016-12-26 NOTE — Anesthesia Preprocedure Evaluation (Addendum)
Anesthesia Evaluation  Patient identified by MRN, date of birth, ID band Patient awake    Reviewed: Allergy & Precautions, H&P , NPO status , Patient's Chart, lab work & pertinent test results, reviewed documented beta blocker date and time   History of Anesthesia Complications Negative for: history of anesthetic complications  Airway Mallampati: II  TM Distance: >3 FB Neck ROM: full    Dental no notable dental hx. (+) Edentulous Upper, Edentulous Lower, Dental Advisory Given   Pulmonary former smoker,     + decreased breath sounds      Cardiovascular + dysrhythmias Atrial Fibrillation  Rhythm:Irregular Rate:Tachycardia  The cavity size was normal. Wall thickness was increased in a pattern of mild LVH. Systolic function was normal. The estimated ejection fraction was in the range of 55% to 60%.    Neuro/Psych    GI/Hepatic negative GI ROS, Neg liver ROS,   Endo/Other  negative endocrine ROS  Renal/GU Renal disease     Musculoskeletal   Abdominal   Peds  Hematology negative hematology ROS (+)   Anesthesia Other Findings   Reproductive/Obstetrics                           Anesthesia Physical  Anesthesia Plan  ASA: III and emergent  Anesthesia Plan: General   Post-op Pain Management:    Induction: Intravenous, Rapid sequence and Cricoid pressure planned  PONV Risk Score and Plan: 3 and Ondansetron, Dexamethasone and Treatment may vary due to age or medical condition  Airway Management Planned: Oral ETT  Additional Equipment:   Intra-op Plan:   Post-operative Plan: Extubation in OR  Informed Consent: I have reviewed the patients History and Physical, chart, labs and discussed the procedure including the risks, benefits and alternatives for the proposed anesthesia with the patient or authorized representative who has indicated his/her understanding and acceptance.   Dental  advisory given  Plan Discussed with: CRNA, Anesthesiologist and Surgeon  Anesthesia Plan Comments: (  )       Anesthesia Quick Evaluation

## 2016-12-27 ENCOUNTER — Encounter (HOSPITAL_COMMUNITY): Payer: Self-pay | Admitting: Urology

## 2016-12-27 DIAGNOSIS — N492 Inflammatory disorders of scrotum: Secondary | ICD-10-CM | POA: Diagnosis present

## 2016-12-27 DIAGNOSIS — Z7901 Long term (current) use of anticoagulants: Secondary | ICD-10-CM | POA: Diagnosis not present

## 2016-12-27 DIAGNOSIS — Z8249 Family history of ischemic heart disease and other diseases of the circulatory system: Secondary | ICD-10-CM | POA: Diagnosis not present

## 2016-12-27 DIAGNOSIS — N5082 Scrotal pain: Secondary | ICD-10-CM | POA: Diagnosis not present

## 2016-12-27 DIAGNOSIS — Z87891 Personal history of nicotine dependence: Secondary | ICD-10-CM | POA: Diagnosis not present

## 2016-12-27 DIAGNOSIS — I4891 Unspecified atrial fibrillation: Secondary | ICD-10-CM | POA: Diagnosis present

## 2016-12-27 LAB — CBC
HCT: 34.5 % — ABNORMAL LOW (ref 39.0–52.0)
Hemoglobin: 11.6 g/dL — ABNORMAL LOW (ref 13.0–17.0)
MCH: 31 pg (ref 26.0–34.0)
MCHC: 33.6 g/dL (ref 30.0–36.0)
MCV: 92.2 fL (ref 78.0–100.0)
PLATELETS: 219 10*3/uL (ref 150–400)
RBC: 3.74 MIL/uL — ABNORMAL LOW (ref 4.22–5.81)
RDW: 13.3 % (ref 11.5–15.5)
WBC: 19.3 10*3/uL — ABNORMAL HIGH (ref 4.0–10.5)

## 2016-12-27 LAB — CREATININE, SERUM
CREATININE: 0.89 mg/dL (ref 0.61–1.24)
GFR calc Af Amer: >60 mL/min (ref >60)
GFR calc non Af Amer: >60 mL/min (ref >60)

## 2016-12-27 MED ORDER — SULFAMETHOXAZOLE-TRIMETHOPRIM 400-80 MG PO TABS
1.0000 | ORAL_TABLET | Freq: Two times a day (BID) | ORAL | Status: DC
  Administered 2016-12-27 – 2016-12-28 (×2): 1 via ORAL
  Filled 2016-12-27 (×2): qty 1

## 2016-12-27 NOTE — Progress Notes (Addendum)
Initial Nutrition Assessment  DOCUMENTATION CODES:   Not applicable  INTERVENTION:  - Continue Ensure Enlive po BID, each supplement provides 350 kcal and 20 grams of protein - Continue to encourage PO intakes of meals and supplements.   NUTRITION DIAGNOSIS:   Unintentional weight loss related to acute illness as evidenced by percent weight loss.  GOAL:   Patient will meet greater than or equal to 90% of their needs  MONITOR:   PO intake, Supplement acceptance, Weight trends, Labs, Skin  REASON FOR ASSESSMENT:   Malnutrition Screening Tool  ASSESSMENT:   83 year old male who presented to the urology clinic today for urodynamics mentioned that he had some right-sided testicular pain.  He was examined and found to have crepitus and fullness in the right scrotum.  Scrotal ultrasound confirmed air within the scrotal tissue as well as evidence of severe epididymal orchitis.  Given this finding the decision was made to proceed emergently for exploration given the concern for possible Fournier's gangrene.  BMI indicates normal weight status. Pt consumed 70% of breakfast this AM (400 kcal, 14 grams of protein) without issue. Pt has a good appetite on average but was having some decrease in appetite the past few days 2/2 pain. He is POD #1 I&D of R scrotal abscess with excision and R orchiectomy. Ensure Enlive has already been ordered BID per ONS protocol and pt accepted bottle of Ensure this AM; will continue this order.  Physical assessment outlined below. Unable to determine with certainty that findings of muscle and fat wasting are d/t malnutrition and weight loss versus losses d/t age-related atrophy. Per chart review, pt has lost 14 lbs (7% body weight) in the past 2 months; this is significant for time frame. Despite this, do not feel comfortable stating malnutrition at this time.   Medications reviewed; 100 mg Colace BID.  Labs reviewed. IVF: NS @ 50 mL/hr.     NUTRITION -  FOCUSED PHYSICAL EXAM:    Most Recent Value  Orbital Region  No depletion  Upper Arm Region  Mild depletion  Thoracic and Lumbar Region  Unable to assess  Buccal Region  Mild depletion  Temple Region  Mild depletion  Clavicle Bone Region  Moderate depletion  Clavicle and Acromion Bone Region  Moderate depletion  Scapular Bone Region  Unable to assess  Dorsal Hand  No depletion  Patellar Region  Unable to assess  Anterior Thigh Region  Unable to assess  Posterior Calf Region  Unable to assess  Edema (RD Assessment)  Unable to assess  Hair  Reviewed  Eyes  Reviewed  Mouth  Reviewed  Skin  Reviewed  Nails  Reviewed       Diet Order:  Diet regular Room service appropriate? Yes; Fluid consistency: Thin  EDUCATION NEEDS:   No education needs have been identified at this time  Skin:  Skin Assessment: Skin Integrity Issues: Skin Integrity Issues:: Incisions Incisions: R scrotum incision from 12/6  Last BM:  12/7  Height:   Ht Readings from Last 1 Encounters:  12/26/16 6' (1.829 m)    Weight:   Wt Readings from Last 1 Encounters:  12/26/16 180 lb 6.4 oz (81.8 kg)    Ideal Body Weight:  80.91 kg  BMI:  Body mass index is 24.47 kg/m.  Estimated Nutritional Needs:   Kcal:  1640-1880 (20-23 kcal/kg)  Protein:  75-85 grams  Fluid:  >/= 1.8 L/day      Jarome Matin, MS, RD, LDN, CNSC Inpatient Clinical Dietitian Pager #  [phone] After hours/weekend pager # [phone]

## 2016-12-27 NOTE — Care Management Obs Status (Signed)
[place] NOTIFICATION   Patient Details  Name: ARMEL RABBANI MRN: [id number] Date of Birth: 01/22/1931   Medicare Observation Status Notification Given:  Yes    Guadalupe Maple, RN 12/27/2016, 3:54 PM

## 2016-12-27 NOTE — Progress Notes (Signed)
Urology Inpatient Progress Report  Scrotal cellulitis  Procedure(s): RIGHT SCROTAL EXPLORATION, RIGHT ORCHIECTOMY POSSIBLE RIGHT ORCHIECTOMY  1 Day Post-Op   Intv/Subj: No acute events overnight. Patient is without complaint. As leukocytosis today but afebrile  Active Problems:   Scrotal infection  Current Facility-Administered Medications  Medication Dose Route Frequency Provider Last Rate Last Dose  . 0.9 %  sodium chloride infusion   Intravenous Continuous Marton Redwood III, MD 50 mL/hr at 12/27/16 0608 1,000 mL at 12/27/16 0608  . acetaminophen (TYLENOL) tablet 650 mg  650 mg Oral Q4H PRN Marton Redwood III, MD      . alfuzosin (UROXATRAL) 24 hr tablet 10 mg  10 mg Oral Q breakfast Marton Redwood III, MD      . diphenhydrAMINE (BENADRYL) injection 12.5 mg  12.5 mg Intravenous Q6H PRN Marton Redwood III, MD       Or  . diphenhydrAMINE (BENADRYL) 12.5 MG/5ML elixir 12.5 mg  12.5 mg Oral Q6H PRN Marton Redwood III, MD      . docusate sodium (COLACE) capsule 100 mg  100 mg Oral BID Marton Redwood III, MD   100 mg at 12/27/16 1021  . enoxaparin (LOVENOX) injection 40 mg  40 mg Subcutaneous Q24H Marton Redwood III, MD   40 mg at 12/27/16 1021  . feeding supplement (ENSURE ENLIVE) (ENSURE ENLIVE) liquid 237 mL  237 mL Oral BID BM Marton Redwood III, MD   237 mL at 12/27/16 1030  . morphine 2 MG/ML injection 2-4 mg  2-4 mg Intravenous Q2H PRN Marton Redwood III, MD      . ondansetron [facility]) injection 4 mg  4 mg Intravenous Q4H PRN Marton Redwood III, MD      . oxyCODONE (Oxy IR/ROXICODONE) immediate release tablet 5 mg  5 mg Oral Q4H PRN Marton Redwood III, MD      . piperacillin-tazobactam (ZOSYN) IVPB 3.375 g  3.375 g Intravenous Q8H Lucas Mallow, MD   Stopped at 12/27/16 1333  . vancomycin (VANCOCIN) 1,500 mg in sodium chloride 0.9 % 500 mL IVPB  1,500 mg Intravenous Q24H Angela Adam, [place]         Objective: Vital: Vitals:   12/27/16 0148 12/27/16 0600 12/27/16  0948 12/27/16 1356  BP: 125/88 (!) 137/92 105/70 105/68  Pulse: 97 91 99 80  Resp: 16 16 20 18   Temp: (!) 97.4 F (36.3 C) 97.7 F (36.5 C) 98.2 F (36.8 C) (!) 97.4 F (36.3 C)  TempSrc: Oral Oral Oral Oral  SpO2: 98% 98% 96% 97%  Weight:      Height:       I/Os: I/O last 3 completed shifts: In: 2787.5 [P.O.:960; I.V.:1227.5; IV Piggyback:600] Out: 1010 [Urine:960; Blood:50]  Physical Exam:  General: Patient is in no apparent distress Lungs: Normal respiratory effort, chest expands symmetrically. GI: The abdomen is soft and nontender without mass. GU: right hemiscrotal incision clean dry and intact.  Penrose drain draining serous fluid.  Mild erythema in the right hemiscrotum seems to be improving.  Nontender to palpation.  No crepitus. Foley: draining clear yellow urine Ext: lower extremities symmetric  Lab Results: Recent Labs    12/26/16 1634 12/27/16 0427  WBC  --  19.3*  HGB 13.4 11.6*  HCT  --  34.5*   Recent Labs    12/26/16 2046 12/27/16 0427  CREATININE 1.02 0.89   Recent Labs    12/26/16 1634  INR 2.03  No results for input(s): LABURIN in the last 72 hours. Results for orders placed or performed during the hospital encounter of 11/04/16  Urine culture     Status: None   Collection Time: 11/04/16 10:40 PM  Result Value Ref Range Status   Specimen Description URINE, RANDOM  Final   Special Requests NONE  Final   Culture   Final    NO GROWTH Performed at [facility], [address] [place], [place] [address]    Report Status 11/06/2016 FINAL  Final    Studies/Results: No results found.  Assessment:scrotal cellulitis and right epididymal orchitis Procedure(s): RIGHT SCROTAL EXPLORATION, RIGHT ORCHIECTOMY POSSIBLE RIGHT ORCHIECTOMY, 1 Day Post-Op  doing well.  Plan: Continue antibiotics and likely transition to by mouth   Link Snuffer, MD Urology 12/27/2016, 3:30 PM

## 2016-12-27 NOTE — Discharge Instructions (Signed)
Discharge instructions following scrotal surgery  Continue the written prescription for bactrim that was provided  Call your doctor for:  Fever is greater than 100.5  Severe nausea or vomiting  Increasing pain not controlled by pain medication  Increasing redness or drainage from incisions  The number for questions or concerns is [phone]  Activity level: No lifting greater than 20 pounds (about equal to milk) for the next 2 weeks or until cleared to do so at follow-up appointment.  Otherwise activity as tolerated by comfort level.  Diet: May resume your regular diet as tolerated  Driving: No driving while still taking opiate pain medications (weight at least 6-8 hours after last dose).  No driving if you still sore from surgery as it may limit her ability to react quickly if necessary.   Shower/bath: May shower and get incision wet pad dry immediately following.  Do not scrub vigorously for the next 2-3 weeks.  Do not soak incision (ID soaking in bath or swimming) until told he may do so by Dr., as this may promote a wound infection.  Wound care: He may cover wounds with sterile gauze as needed to prevent incisions rubbing on close follow-up in any seepage.  Where tight fitting underpants/scrotal support for at least 2 weeks.  He should apply cold compresses (ice or sac of frozen peas/corn) to your scrotum for at least 48 hours to reduce the swelling for 15 minutes at a time indirectly.  You should expect that his scrotum will swell up initially and then get smaller over the next 2-4 weeks.

## 2016-12-28 LAB — CREATININE, SERUM: Creatinine, Ser: 0.89 mg/dL (ref 0.61–1.24)

## 2016-12-28 MED ORDER — SULFAMETHOXAZOLE-TRIMETHOPRIM 400-80 MG PO TABS: 1.0000 | ORAL_TABLET | Freq: Two times a day (BID) | ORAL | 0 refills | Status: DC

## 2016-12-28 NOTE — Discharge Summary (Signed)
Patient ID: Johnny Patel MRN: [id number] DOB/AGE: Jan 12, 1932 83 y.o.  Admit date: 12/26/2016 Discharge date: 12/28/2016  Primary Care Physician:  Patient, No Pcp Per  Discharge Diagnoses:  Scrotal infection Present on Admission: . Scrotal infection   Consults:  None     Discharge Medications: Allergies as of 12/28/2016   No Known Allergies     Medication List    TAKE these medications   alfuzosin 10 MG 24 hr tablet Commonly known as:  UROXATRAL Take 10 mg by mouth daily with breakfast.   rivaroxaban 20 MG Tabs tablet Commonly known as:  XARELTO Take 1 tablet (20 mg total) daily with supper by mouth.   sulfamethoxazole-trimethoprim 400-80 MG tablet Commonly known as:  BACTRIM,SEPTRA Take 1 tablet by mouth every 12 (twelve) hours.   tamsulosin 0.4 MG Caps capsule Commonly known as:  FLOMAX Take 1 capsule (0.4 mg total) by mouth daily after supper.   VITAMIN D PO Take 1 tablet by mouth daily.        Significant Diagnostic Studies:  No results found.  Brief H and P: For complete details please refer to admission H and P, but in brief the pt was admitted for a scrotal infection  Hospital Course:  Active Problems:   Scrotal infection   Day of Discharge BP 132/76 (BP Location: Right Arm)   Pulse 70   Temp 97.8 F (36.6 C) (Oral)   Resp 18   Ht 6' (1.829 m)   Wt 81.8 kg (180 lb 6.4 oz)   SpO2 94%   BMI 24.47 kg/m   Results for orders placed or performed during the hospital encounter of 12/26/16 (from the past 24 hour(s))  Creatinine, serum     Status: None   Collection Time: 12/28/16  4:31 AM  Result Value Ref Range   Creatinine, Ser 0.89 0.61 - 1.24 mg/dL   GFR calc non Af Amer >60 >60 mL/min   GFR calc Af Amer >60 >60 mL/min    Physical Exam: General: Alert and awake oriented x3 not in any acute distress. HEENT: anicteric sclera, pupils reactive to light and accommodation CVS: S1-S2 clear no murmur rubs or gallops Chest: clear to auscultation  bilaterally, no wheezing rales or rhonchi Abdomen: soft nontender, nondistended, normal bowel sounds, no organomegaly Extremities: no cyanosis, clubbing or edema noted bilaterally Neuro: Cranial nerves II-XII intact, no focal neurological deficits  Disposition:  Home  Diet:  Regular  Activity:  Discussed w/ pt   Disposition and Follow-up:    Home--followup arranged w/ Dr Alyson Ingles  TESTS THAT NEED FOLLOW-UP  None  DISCHARGE FOLLOW-UP Follow-up Information    McKenzie, Candee Furbish, MD Follow up.   Specialty:  Urology Contact information: [address] [place] [address] [phone]           Time spent on Discharge:  10 mins  Signed: Lillette Boxer Rayvin Abid 12/28/2016, 7:28 AM

## 2016-12-28 NOTE — Progress Notes (Signed)
Nurse reviewed discharge instructions with pt and his wife.  Pt verbalized understanding of discharge instructions, follow up appointment and new medications.  Nurse showed pt and his wife how to change standard foley catheter bag to leg bag.  All questions answered.

## 2017-01-09 ENCOUNTER — Emergency Department (HOSPITAL_COMMUNITY): Payer: Medicare Other

## 2017-01-09 ENCOUNTER — Encounter (HOSPITAL_COMMUNITY): Payer: Self-pay | Admitting: Emergency Medicine

## 2017-01-09 ENCOUNTER — Other Ambulatory Visit: Payer: Self-pay

## 2017-01-09 ENCOUNTER — Inpatient Hospital Stay (HOSPITAL_COMMUNITY)
Admission: EM | Admit: 2017-01-09 | Discharge: 2017-01-13 | DRG: 377 | Disposition: A | Discharge status: Home / Self Care | Payer: Medicare Other | Attending: Internal Medicine | Admitting: Internal Medicine

## 2017-01-09 DIAGNOSIS — D62 Acute posthemorrhagic anemia: Secondary | ICD-10-CM | POA: Diagnosis present

## 2017-01-09 DIAGNOSIS — K922 Gastrointestinal hemorrhage, unspecified: Secondary | ICD-10-CM | POA: Diagnosis not present

## 2017-01-09 DIAGNOSIS — K6289 Other specified diseases of anus and rectum: Secondary | ICD-10-CM | POA: Diagnosis not present

## 2017-01-09 DIAGNOSIS — K648 Other hemorrhoids: Secondary | ICD-10-CM | POA: Diagnosis present

## 2017-01-09 DIAGNOSIS — Z801 Family history of malignant neoplasm of trachea, bronchus and lung: Secondary | ICD-10-CM

## 2017-01-09 DIAGNOSIS — Z9079 Acquired absence of other genital organ(s): Secondary | ICD-10-CM | POA: Diagnosis not present

## 2017-01-09 DIAGNOSIS — K5730 Diverticulosis of large intestine without perforation or abscess without bleeding: Secondary | ICD-10-CM | POA: Diagnosis present

## 2017-01-09 DIAGNOSIS — Z7901 Long term (current) use of anticoagulants: Secondary | ICD-10-CM | POA: Diagnosis not present

## 2017-01-09 DIAGNOSIS — N139 Obstructive and reflux uropathy, unspecified: Secondary | ICD-10-CM | POA: Diagnosis present

## 2017-01-09 DIAGNOSIS — D123 Benign neoplasm of transverse colon: Secondary | ICD-10-CM | POA: Diagnosis present

## 2017-01-09 DIAGNOSIS — Z79899 Other long term (current) drug therapy: Secondary | ICD-10-CM | POA: Diagnosis not present

## 2017-01-09 DIAGNOSIS — Z8249 Family history of ischemic heart disease and other diseases of the circulatory system: Secondary | ICD-10-CM

## 2017-01-09 DIAGNOSIS — D72829 Elevated white blood cell count, unspecified: Secondary | ICD-10-CM | POA: Diagnosis present

## 2017-01-09 DIAGNOSIS — D125 Benign neoplasm of sigmoid colon: Secondary | ICD-10-CM | POA: Diagnosis present

## 2017-01-09 DIAGNOSIS — Z7983 Long term (current) use of bisphosphonates: Secondary | ICD-10-CM | POA: Diagnosis not present

## 2017-01-09 DIAGNOSIS — E43 Unspecified severe protein-calorie malnutrition: Secondary | ICD-10-CM | POA: Diagnosis present

## 2017-01-09 DIAGNOSIS — K921 Melena: Secondary | ICD-10-CM | POA: Diagnosis present

## 2017-01-09 DIAGNOSIS — K2971 Gastritis, unspecified, with bleeding: Secondary | ICD-10-CM | POA: Diagnosis not present

## 2017-01-09 DIAGNOSIS — I4891 Unspecified atrial fibrillation: Secondary | ICD-10-CM | POA: Diagnosis present

## 2017-01-09 DIAGNOSIS — Z960 Presence of urogenital implants: Secondary | ICD-10-CM | POA: Diagnosis present

## 2017-01-09 DIAGNOSIS — R000 Tachycardia, unspecified: Secondary | ICD-10-CM | POA: Diagnosis not present

## 2017-01-09 DIAGNOSIS — K644 Residual hemorrhoidal skin tags: Secondary | ICD-10-CM | POA: Diagnosis present

## 2017-01-09 DIAGNOSIS — D649 Anemia, unspecified: Secondary | ICD-10-CM

## 2017-01-09 DIAGNOSIS — Z87891 Personal history of nicotine dependence: Secondary | ICD-10-CM | POA: Diagnosis not present

## 2017-01-09 DIAGNOSIS — Z6823 Body mass index (BMI) 23.0-23.9, adult: Secondary | ICD-10-CM

## 2017-01-09 DIAGNOSIS — I700 Atherosclerosis of aorta: Secondary | ICD-10-CM

## 2017-01-09 DIAGNOSIS — N201 Calculus of ureter: Secondary | ICD-10-CM | POA: Diagnosis not present

## 2017-01-09 DIAGNOSIS — R42 Dizziness and giddiness: Secondary | ICD-10-CM | POA: Diagnosis not present

## 2017-01-09 DIAGNOSIS — R339 Retention of urine, unspecified: Secondary | ICD-10-CM

## 2017-01-09 DIAGNOSIS — Z888 Allergy status to other drugs, medicaments and biological substances status: Secondary | ICD-10-CM | POA: Diagnosis not present

## 2017-01-09 DIAGNOSIS — D509 Iron deficiency anemia, unspecified: Secondary | ICD-10-CM | POA: Diagnosis not present

## 2017-01-09 DIAGNOSIS — K317 Polyp of stomach and duodenum: Secondary | ICD-10-CM | POA: Diagnosis not present

## 2017-01-09 DIAGNOSIS — I951 Orthostatic hypotension: Secondary | ICD-10-CM | POA: Diagnosis not present

## 2017-01-09 DIAGNOSIS — K7689 Other specified diseases of liver: Secondary | ICD-10-CM

## 2017-01-09 DIAGNOSIS — K8020 Calculus of gallbladder without cholecystitis without obstruction: Secondary | ICD-10-CM

## 2017-01-09 HISTORY — DX: Other specified diseases of liver: K76.89

## 2017-01-09 HISTORY — DX: Atherosclerosis of aorta: I70.0

## 2017-01-09 HISTORY — DX: Calculus of gallbladder without cholecystitis without obstruction: K80.20

## 2017-01-09 LAB — COMPREHENSIVE METABOLIC PANEL WITH GFR
ALT: 23 U/L (ref 17–63)
AST: 28 U/L (ref 15–41)
Albumin: 3.1 g/dL — ABNORMAL LOW (ref 3.5–5.0)
Alkaline Phosphatase: 42 U/L (ref 38–126)
Anion gap: 9 (ref 5–15)
BUN: 40 mg/dL — ABNORMAL HIGH (ref 6–20)
CO2: 22 mmol/L (ref 22–32)
Calcium: 9.1 mg/dL (ref 8.9–10.3)
Chloride: 105 mmol/L (ref 101–111)
Creatinine, Ser: 1.13 mg/dL (ref 0.61–1.24)
GFR calc Af Amer: >60 mL/min (ref >60)
GFR calc non Af Amer: 58 mL/min — ABNORMAL LOW (ref >60)
Glucose, Bld: 156 mg/dL — ABNORMAL HIGH (ref 65–99)
Potassium: 4.6 mmol/L (ref 3.5–5.1)
Sodium: 136 mmol/L (ref 135–145)
Total Bilirubin: 0.8 mg/dL (ref 0.3–1.2)
Total Protein: 6.1 g/dL — ABNORMAL LOW (ref 6.5–8.1)

## 2017-01-09 LAB — CBC WITH DIFFERENTIAL/PLATELET
BASOS ABS: 0.1 10*3/uL (ref 0.0–0.1)
BASOS PCT: 0 %
EOS PCT: 0 %
Eosinophils Absolute: 0 10*3/uL (ref 0.0–0.7)
HCT: 24 % — ABNORMAL LOW (ref 39.0–52.0)
Hemoglobin: 8.1 g/dL — ABNORMAL LOW (ref 13.0–17.0)
Lymphocytes Relative: 10 %
Lymphs Abs: 2 10*3/uL (ref 0.7–4.0)
MCH: 31 pg (ref 26.0–34.0)
MCHC: 33.8 g/dL (ref 30.0–36.0)
MCV: 92 fL (ref 78.0–100.0)
MONO ABS: 0.7 10*3/uL (ref 0.1–1.0)
Monocytes Relative: 4 %
Neutro Abs: 16.2 10*3/uL — ABNORMAL HIGH (ref 1.7–7.7)
Neutrophils Relative %: 86 %
PLATELETS: 532 10*3/uL — AB (ref 150–400)
RBC: 2.61 MIL/uL — ABNORMAL LOW (ref 4.22–5.81)
RDW: 14.2 % (ref 11.5–15.5)
WBC: 18.9 10*3/uL — ABNORMAL HIGH (ref 4.0–10.5)

## 2017-01-09 LAB — URINALYSIS, ROUTINE W REFLEX MICROSCOPIC
Bacteria, UA: NONE SEEN
Bilirubin Urine: NEGATIVE
Glucose, UA: NEGATIVE mg/dL
Ketones, ur: NEGATIVE mg/dL
Nitrite: NEGATIVE
Protein, ur: NEGATIVE mg/dL
Specific Gravity, Urine: 1.025 (ref 1.005–1.030)
Squamous Epithelial / HPF: NONE SEEN
pH: 5 (ref 5.0–8.0)

## 2017-01-09 LAB — LIPASE, BLOOD: Lipase: 26 U/L (ref 11–51)

## 2017-01-09 LAB — I-STAT TROPONIN, ED: Troponin i, poc: 0 ng/mL (ref 0.00–0.08)

## 2017-01-09 LAB — PREPARE RBC (CROSSMATCH)

## 2017-01-09 LAB — I-STAT CG4 LACTIC ACID, ED
LACTIC ACID, VENOUS: 2.14 mmol/L — AB (ref 0.5–1.9)
Lactic Acid, Venous: 2.82 mmol/L (ref 0.5–1.9)

## 2017-01-09 LAB — POC OCCULT BLOOD, ED: Fecal Occult Bld: POSITIVE — AB

## 2017-01-09 LAB — D-DIMER, QUANTITATIVE: D-Dimer, Quant: 2.79 ug/mL-FEU — ABNORMAL HIGH (ref 0.00–0.50)

## 2017-01-09 MED ORDER — PANTOPRAZOLE SODIUM 40 MG IV SOLR
40.0000 mg | Freq: Once | INTRAVENOUS | Status: AC
  Administered 2017-01-09: 40 mg via INTRAVENOUS
  Filled 2017-01-09: qty 40

## 2017-01-09 MED ORDER — ACETAMINOPHEN 325 MG PO TABS: 650.0000 mg | ORAL_TABLET | Freq: Four times a day (QID) | ORAL | Status: DC | PRN

## 2017-01-09 MED ORDER — SODIUM CHLORIDE 0.9 % IV SOLN: Freq: Once | INTRAVENOUS | Status: DC

## 2017-01-09 MED ORDER — SODIUM CHLORIDE 0.9 % IV BOLUS (SEPSIS)
1000.0000 mL | Freq: Once | INTRAVENOUS | Status: AC
  Administered 2017-01-09: 1000 mL via INTRAVENOUS

## 2017-01-09 MED ORDER — SODIUM CHLORIDE 0.9 % IV SOLN
8.0000 mg/h | INTRAVENOUS | Status: DC
  Administered 2017-01-10 – 2017-01-11 (×4): 8 mg/h via INTRAVENOUS
  Filled 2017-01-09 (×6): qty 80

## 2017-01-09 MED ORDER — IOPAMIDOL (ISOVUE-370) INJECTION 76%
INTRAVENOUS | Status: AC
  Administered 2017-01-09: 100 mL via INTRAVENOUS
  Filled 2017-01-09: qty 100

## 2017-01-09 MED ORDER — PANTOPRAZOLE SODIUM 40 MG IV SOLR: 40.0000 mg | Freq: Two times a day (BID) | INTRAVENOUS | Status: DC

## 2017-01-09 MED ORDER — ONDANSETRON HCL 4 MG PO TABS: 4.0000 mg | ORAL_TABLET | Freq: Four times a day (QID) | ORAL | Status: DC | PRN

## 2017-01-09 MED ORDER — ACETAMINOPHEN 650 MG RE SUPP: 650.0000 mg | Freq: Four times a day (QID) | RECTAL | Status: DC | PRN

## 2017-01-09 MED ORDER — SODIUM CHLORIDE 0.9 % IV SOLN
INTRAVENOUS | Status: AC
  Administered 2017-01-10: via INTRAVENOUS

## 2017-01-09 MED ORDER — ONDANSETRON HCL 4 MG/2ML IJ SOLN: 4.0000 mg | Freq: Four times a day (QID) | INTRAMUSCULAR | Status: DC | PRN

## 2017-01-09 MED ORDER — IOPAMIDOL (ISOVUE-370) INJECTION 76%
100.0000 mL | Freq: Once | INTRAVENOUS | Status: AC | PRN
  Administered 2017-01-09: 100 mL via INTRAVENOUS

## 2017-01-09 NOTE — ED Notes (Signed)
While doing orthostatic vitals pt was unable to tolerate standing for 3 mins

## 2017-01-09 NOTE — ED Triage Notes (Signed)
Patient presents with wife stating he has been losing weight and feeling fatigued. Has dizziness yesterday when standing and now having difficulty standing alone. Pt states he has intermittent nausea. States he has a catheter due to urinary retention. Pt states Dr. Noah Delaine sent patient to be evaluated. Wife adds 2 weeks ago pt had surgery to remove a testicle due to infection.

## 2017-01-09 NOTE — ED Notes (Signed)
ED TO INPATIENT HANDOFF REPORT  Name/Age/Gender Johnny Patel 83 y.o. male  Code Status Code Status History    Date Active Date Inactive Code Status Order ID Comments User Context   12/26/2016 20:41 12/28/2016 16:08 Full Code [id number]  Lucas Mallow, MD Inpatient   11/21/2016 17:00 11/22/2016 20:10 Full Code [id number]  Cleon Gustin, MD Inpatient   11/05/2016 00:32 11/08/2016 01:20 Full Code [id number]  Jani Gravel, MD ED      Home/SNF/Other Home  Chief Complaint ftt/wt loss  Level of Care/Admitting Diagnosis ED Disposition    ED Disposition Condition [place]: [facility] [100102]  Level of Care: Stepdown [14]  Admit to SDU based on following criteria: Hemodynamic compromise or significant risk of instability:  Patient requiring short term acute titration and management of vasoactive drips, and invasive monitoring (i.e., CVP and Arterial line).  Diagnosis: Acute GI bleeding [154008]  Admitting Physician: Rise Patience [[id number]]  Attending Physician: Rise Patience [phone]  Estimated length of stay: past midnight tomorrow  Certification:: I certify this patient will need inpatient services for at least 2 midnights  PT Class (Do Not Modify): Inpatient [101]  PT Acc Code (Do Not Modify): Private [1]       Medical History Past Medical History:  Diagnosis Date  . Dysrhythmia    atrial fibrillation  . History of kidney stones     Allergies Allergies  Allergen Reactions  . Flomax [Tamsulosin Hcl] Nausea Only    IV Location/Drains/Wounds Patient Lines/Drains/Airways Status   Active Line/Drains/Airways    Name:   Placement date:   Placement time:   Site:   Days:   Peripheral IV 01/09/17 Right   01/09/17    1700    -   less than 1   Peripheral IV 01/09/17 Left Antecubital   01/09/17    1705    Antecubital   less than 1   Open Drain 1 Right Other (Comment)   12/26/16    1845    Other (Comment)   14   Urethral  Catheter Derek Mound, RN [place] 16 Fr.   11/29/16    2049    [place]   41   Urethral Catheter DR.BELL Latex 16 Fr.   12/26/16    1857    Latex   14   Incision (Closed) 12/26/16 Scrotum Right   12/26/16    1906     14          Labs/Imaging Results for orders placed or performed during the hospital encounter of 01/09/17 (from the past 48 hour(s))  Comprehensive metabolic panel     Status: Abnormal   Collection Time: 01/09/17  5:07 PM  Result Value Ref Range   Sodium 136 135 - 145 mmol/L   Potassium 4.6 3.5 - 5.1 mmol/L   Chloride 105 101 - 111 mmol/L   CO2 22 22 - 32 mmol/L   Glucose, Bld 156 (H) 65 - 99 mg/dL   BUN 40 (H) 6 - 20 mg/dL   Creatinine, Ser 1.13 0.61 - 1.24 mg/dL   Calcium 9.1 8.9 - 10.3 mg/dL   Total Protein 6.1 (L) 6.5 - 8.1 g/dL   Albumin 3.1 (L) 3.5 - 5.0 g/dL   AST 28 15 - 41 U/L   ALT 23 17 - 63 U/L   Alkaline Phosphatase 42 38 - 126 U/L   Total Bilirubin 0.8 0.3 - 1.2 mg/dL   GFR calc  non Af Amer 58 (L) >60 mL/min   GFR calc Af Amer >60 >60 mL/min    Comment: (NOTE) The eGFR has been calculated using the CKD EPI equation. This calculation has not been validated in all clinical situations. eGFR's persistently <60 mL/min signify possible Chronic Kidney Disease.    Anion gap 9 5 - 15  Lipase, blood     Status: None   Collection Time: 01/09/17  5:07 PM  Result Value Ref Range   Lipase 26 11 - 51 U/L  CBC with Differential     Status: Abnormal   Collection Time: 01/09/17  5:07 PM  Result Value Ref Range   WBC 18.9 (H) 4.0 - 10.5 K/uL   RBC 2.61 (L) 4.22 - 5.81 MIL/uL   Hemoglobin 8.1 (L) 13.0 - 17.0 g/dL   HCT 24.0 (L) 39.0 - 52.0 %   MCV 92.0 78.0 - 100.0 fL   MCH 31.0 26.0 - 34.0 pg   MCHC 33.8 30.0 - 36.0 g/dL   RDW 14.2 11.5 - 15.5 %   Platelets 532 (H) 150 - 400 K/uL   Neutrophils Relative % 86 %   Neutro Abs 16.2 (H) 1.7 - 7.7 K/uL   Lymphocytes Relative 10 %   Lymphs Abs 2.0 0.7 - 4.0 K/uL   Monocytes Relative 4 %   Monocytes Absolute 0.7  0.1 - 1.0 K/uL   Eosinophils Relative 0 %   Eosinophils Absolute 0.0 0.0 - 0.7 K/uL   Basophils Relative 0 %   Basophils Absolute 0.1 0.0 - 0.1 K/uL  D-dimer, quantitative     Status: Abnormal   Collection Time: 01/09/17  5:07 PM  Result Value Ref Range   D-Dimer, Quant 2.79 (H) 0.00 - 0.50 ug/mL-FEU    Comment: (NOTE) At the manufacturer cut-off of 0.50 ug/mL FEU, this assay has been documented to exclude PE with a sensitivity and negative predictive value of 97 to 99%.  At this time, this assay has not been approved by the FDA to exclude DVT/VTE. Results should be correlated with clinical presentation.   I-Stat CG4 Lactic Acid, ED     Status: Abnormal   Collection Time: 01/09/17  5:17 PM  Result Value Ref Range   Lactic Acid, Venous 2.82 (HH) 0.5 - 1.9 mmol/L   Comment NOTIFIED PHYSICIAN   Urinalysis, Routine w reflex microscopic     Status: Abnormal   Collection Time: 01/09/17  6:03 PM  Result Value Ref Range   Color, Urine YELLOW YELLOW   APPearance CLOUDY (A) CLEAR   Specific Gravity, Urine 1.025 1.005 - 1.030   pH 5.0 5.0 - 8.0   Glucose, UA NEGATIVE NEGATIVE mg/dL   Hgb urine dipstick LARGE (A) NEGATIVE   Bilirubin Urine NEGATIVE NEGATIVE   Ketones, ur NEGATIVE NEGATIVE mg/dL   Protein, ur NEGATIVE NEGATIVE mg/dL   Nitrite NEGATIVE NEGATIVE   Leukocytes, UA LARGE (A) NEGATIVE   RBC / HPF TOO NUMEROUS TO COUNT 0 - 5 RBC/hpf   WBC, UA 6-30 0 - 5 WBC/hpf   Bacteria, UA NONE SEEN NONE SEEN   Squamous Epithelial / LPF NONE SEEN NONE SEEN   Mucus PRESENT    Hyaline Casts, UA PRESENT    Amorphous Crystal PRESENT   I-stat troponin, ED     Status: None   Collection Time: 01/09/17  6:14 PM  Result Value Ref Range   Troponin i, poc 0.00 0.00 - 0.08 ng/mL   Comment 3  Comment: Due to the release kinetics of cTnI, a negative result within the first hours of the onset of symptoms does not rule out myocardial infarction with certainty. If myocardial infarction is  still suspected, repeat the test at appropriate intervals.   I-Stat CG4 Lactic Acid, ED     Status: Abnormal   Collection Time: 01/09/17  6:16 PM  Result Value Ref Range   Lactic Acid, Venous 2.14 (HH) 0.5 - 1.9 mmol/L   Comment NOTIFIED PHYSICIAN   POC occult blood, ED Provider will collect     Status: Abnormal   Collection Time: 01/09/17  7:41 PM  Result Value Ref Range   Fecal Occult Bld POSITIVE (A) NEGATIVE   Dg Chest 2 View  Result Date: 01/09/2017 CLINICAL DATA:  Tachycardia, elevated lactate, weight loss EXAM: CHEST  2 VIEW COMPARISON:  None ; correlation CT abdomen and pelvis 11/05/2016 FINDINGS: Normal heart size, mediastinal contours, and pulmonary vascularity. Mild tortuosity of thoracic aorta. Lungs clear. No pleural effusion or pneumothorax. 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 IMPRESSION: No acute pulmonary abnormalities. Electronically Signed   By: Lavonia Dana M.D.   On: 01/09/2017 18:46   Ct Angio Chest Pe W And/or Wo Contrast  Result Date: 01/09/2017 CLINICAL DATA:  Weight loss and fatigue with dizziness and difficulty standing along. Intermittent nausea. EXAM: CT ANGIOGRAPHY CHEST WITH CONTRAST TECHNIQUE: Multidetector CT imaging of the chest was performed using the standard protocol during bolus administration of intravenous contrast. Multiplanar CT image reconstructions and MIPs were obtained to evaluate the vascular anatomy. CONTRAST:  90 mL Isovue 370 IV. COMPARISON:  CT abdomen/pelvis 11/05/2016 FINDINGS: Cardiovascular: Heart is normal size. Mild calcified plaque over the thoracic aorta. No evidence of pulmonary embolism. Mediastinum/Nodes: No hilar or mediastinal adenopathy. Remaining mediastinal structures are unremarkable. Lungs/Pleura: Lungs are well inflated without focal  consolidation or effusion. There is focal fatty pleural thickening along the wall lateral right lower thorax unchanged. Airways are normal. Upper Abdomen: Couple liver cysts unchanged with the larger measuring 3.2 cm. 2.2 cm gallstone. Musculoskeletal: Degenerative change of the spine. Review of the MIP images confirms the above findings. IMPRESSION: No evidence of pulmonary embolism. No acute cardiopulmonary disease. 2.2 cm gallstone. Two liver cysts unchanged. Aortic Atherosclerosis (ICD10-I70.0). Electronically Signed   By: Marin Olp M.D.   On: 01/09/2017 21:39    Pending Labs Unresulted Labs (From admission, onward)   Start     Ordered   01/09/17 2210  Prepare RBC  (Adult Blood Administration - Red Blood Cells)  Once,   R    Question Answer Comment  # of Units 2 units   Transfusion Indications Actively Bleeding / GI Bleed   Transfusion Indications Symptomatic Anemia   Number of Units to Keep Ahead 2 units ahead   If emergent release call blood bank [facility] [phone]   Instructions: Transfuse      01/09/17 2209   01/09/17 2159  Hemoglobin and hematocrit, blood  STAT,   STAT     01/09/17 2158   01/09/17 2159  Type and screen [place]  Once,   STAT    Comments:  [place]    01/09/17 2158      Vitals/Pain Today's Vitals   01/09/17 1930 01/09/17 2003 01/09/17 2030 01/09/17 2150  BP: 111/76 105/75 103/69  97/66  Pulse: 91 91 95 96  Resp: '18 17 19 19  ' Temp:      TempSrc:      SpO2: 98% 97% 99% 97%  PainSc:        Isolation Precautions No active isolations  Medications Medications  0.9 %  sodium chloride infusion (not administered)  sodium chloride 0.9 % bolus 1,000 mL (0 mLs Intravenous Stopped 01/09/17 1811)  sodium chloride 0.9 % bolus 1,000 mL (0 mLs Intravenous Stopped 01/09/17 2041)  pantoprazole (PROTONIX) injection 40 mg (40 mg Intravenous Given 01/09/17 2040)  iopamidol (ISOVUE-370) 76 % injection 100 mL (100 mLs  Intravenous Contrast Given 01/09/17 2102)  sodium chloride 0.9 % bolus 1,000 mL (1,000 mLs Intravenous New Bag/Given 01/09/17 2222)    Mobility walks

## 2017-01-09 NOTE — H&P (Signed)
History and Physical    ASAF ELMQUIST WUJ:[id number] DOB: [birth year] DOA: 01/09/2017  PCP: Patient, No Pcp Per  Patient coming from: Home.  Chief Complaint: Weakness.  HPI: Johnny Patel is a 83 y.o. male with history of recently diagnosed atrial fibrillation and obstructive uropathy who had undergone cystoscopy with stone extraction is on Foley catheter placement had gone to get his urodynamic study done today and the urology office when patient was finding it very weak.  Patient stated over the last 2 days has been feeling dizzy and weak but did not pass out.  Denies any chest pain or shortness of breath but has noticed black stools.  Denies taking any NSAIDs but does take some Tylenol for pain.  Patient is on Xarelto for A. fib.  ED Course: Patient came to the ER and was found to be having low normal blood pressure lactate was elevated hemoglobin had dropped from 11 g to 8 g.  Patient also was tachycardic with A. fib with RVR.  Patient was given fluid bolus and started on 2 units of PRBC transfusion.  On-call gastroenterologist Dr. Bobbe Medico has been consulted and patient is placed on Protonix drip.  Review of Systems: As per HPI, rest all negative.   Past Medical History:  Diagnosis Date  . Dysrhythmia    atrial fibrillation  . History of kidney stones     Past Surgical History:  Procedure Laterality Date  . CYSTOSCOPY WITH RETROGRADE PYELOGRAM, URETEROSCOPY AND STENT PLACEMENT Right 11/21/2016   Procedure: CYSTOSCOPY WITH RETROGRADE PYELOGRAM, URETEROSCOPY AND STENT PLACEMENT;  Surgeon: Cleon Gustin, MD;  Location: [facility];  Service: Urology;  Laterality: Right;  . HERNIA REPAIR  1974  . HOLMIUM LASER APPLICATION Right 95/06/2128   Procedure: HOLMIUM LASER APPLICATION;  Surgeon: Cleon Gustin, MD;  Location: [facility];  Service: Urology;  Laterality: Right;  . IRRIGATION AND DEBRIDEMENT ABSCESS Right 12/26/2016   Procedure: RIGHT SCROTAL EXPLORATION, RIGHT ORCHIECTOMY;   Surgeon: Lucas Mallow, MD;  Location: [facility];  Service: Urology;  Laterality: Right;  . ORCHIECTOMY Right 12/26/2016   Procedure: POSSIBLE RIGHT ORCHIECTOMY;  Surgeon: Lucas Mallow, MD;  Location: [facility];  Service: Urology;  Laterality: Right;     reports that he quit smoking about 46 years ago. His smoking use included cigarettes. he has never used smokeless tobacco. He reports that he drinks alcohol. He reports that he does not use drugs.  Allergies  Allergen Reactions  . Flomax [Tamsulosin Hcl] Nausea Only    Family History  Problem Relation Age of Onset  . Dementia Mother   . Lung cancer Father        smoker  . CAD Father        MI at age 65    Prior to Admission medications   Medication Sig Start Date End Date Taking? Authorizing Provider  rivaroxaban (XARELTO) 20 MG TABS tablet Take 1 tablet (20 mg total) daily with supper by mouth. 11/29/16  Yes Fredia Sorrow, MD  sulfamethoxazole-trimethoprim (BACTRIM,SEPTRA) 400-80 MG tablet Take 1 tablet by mouth every 12 (twelve) hours. Patient not taking: Reported on 01/09/2017 12/28/16   Franchot Gallo, MD  tamsulosin (FLOMAX) 0.4 MG CAPS capsule Take 1 capsule (0.4 mg total) by mouth daily after supper. Patient not taking: Reported on 01/09/2017 11/07/16   Eber Jones, MD    Physical Exam: Vitals:   01/09/17 1930 01/09/17 2003 01/09/17 2030 01/09/17 2150  BP: 111/76 105/75 103/69 97/66  Pulse: 91 91 95 96  Resp: 18 17 19 19   Temp:      TempSrc:      SpO2: 98% 97% 99% 97%      Constitutional: Moderately built and nourished. Vitals:   01/09/17 1930 01/09/17 2003 01/09/17 2030 01/09/17 2150  BP: 111/76 105/75 103/69 97/66  Pulse: 91 91 95 96  Resp: 18 17 19 19   Temp:      TempSrc:      SpO2: 98% 97% 99% 97%   Eyes: Anicteric no pallor. ENMT: No discharge from the ears eyes nose or mouth. Neck: No mass felt.  No neck rigidity. Respiratory: No rhonchi or crepitations. Cardiovascular: S1-S2  heard no murmurs appreciated. Abdomen: Soft nontender bowel sounds present. Musculoskeletal: No edema.  No joint effusion. Skin: No rash.  Skin appears warm. Neurologic: Alert awake oriented to time place and person.  Moves all extremities. Psychiatric: Appears normal.  Normal affect.   Labs on Admission: I have personally reviewed following labs and imaging studies  CBC: Recent Labs  Lab 01/09/17 1707  WBC 18.9*  NEUTROABS 16.2*  HGB 8.1*  HCT 24.0*  MCV 92.0  PLT 962*   Basic Metabolic Panel: Recent Labs  Lab 01/09/17 1707  NA 136  K 4.6  CL 105  CO2 22  GLUCOSE 156*  BUN 40*  CREATININE 1.13  CALCIUM 9.1   GFR: CrCl cannot be calculated (Unknown ideal weight.). Liver Function Tests: Recent Labs  Lab 01/09/17 1707  AST 28  ALT 23  ALKPHOS 42  BILITOT 0.8  PROT 6.1*  ALBUMIN 3.1*   Recent Labs  Lab 01/09/17 1707  LIPASE 26   No results for input(s): AMMONIA in the last 168 hours. Coagulation Profile: No results for input(s): INR, PROTIME in the last 168 hours. Cardiac Enzymes: No results for input(s): CKTOTAL, CKMB, CKMBINDEX, TROPONINI in the last 168 hours. BNP (last 3 results) No results for input(s): PROBNP in the last 8760 hours. HbA1C: No results for input(s): HGBA1C in the last 72 hours. CBG: No results for input(s): GLUCAP in the last 168 hours. Lipid Profile: No results for input(s): CHOL, HDL, LDLCALC, TRIG, CHOLHDL, LDLDIRECT in the last 72 hours. Thyroid Function Tests: No results for input(s): TSH, T4TOTAL, FREET4, T3FREE, THYROIDAB in the last 72 hours. Anemia Panel: No results for input(s): VITAMINB12, FOLATE, FERRITIN, TIBC, IRON, RETICCTPCT in the last 72 hours. Urine analysis:    Component Value Date/Time   COLORURINE YELLOW 01/09/2017 1803   APPEARANCEUR CLOUDY (A) 01/09/2017 1803   LABSPEC 1.025 01/09/2017 1803   PHURINE 5.0 01/09/2017 1803   GLUCOSEU NEGATIVE 01/09/2017 1803   HGBUR LARGE (A) 01/09/2017 1803    BILIRUBINUR NEGATIVE 01/09/2017 1803   KETONESUR NEGATIVE 01/09/2017 1803   PROTEINUR NEGATIVE 01/09/2017 1803   NITRITE NEGATIVE 01/09/2017 1803   LEUKOCYTESUR LARGE (A) 01/09/2017 1803   Sepsis Labs: @LABRCNTIP (procalcitonin:4,lacticidven:4) )No results found for this or any previous visit (from the past 240 hour(s)).   Radiological Exams on Admission: Dg Chest 2 View  Result Date: 01/09/2017 CLINICAL DATA:  Tachycardia, elevated lactate, weight loss EXAM: CHEST  2 VIEW COMPARISON:  None ; correlation CT abdomen and pelvis 11/05/2016 FINDINGS: Normal heart size, mediastinal contours, and pulmonary vascularity. Mild tortuosity of thoracic aorta. Lungs clear. No pleural effusion or pneumothorax. 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 IMPRESSION: No acute pulmonary abnormalities. Electronically Signed   By: Lavonia Dana M.D.   On: 01/09/2017 18:46   Ct Angio Chest Pe W And/or Wo Contrast  Result Date: 01/09/2017 CLINICAL DATA:  Weight loss and fatigue with dizziness and difficulty standing along. Intermittent nausea. EXAM: CT ANGIOGRAPHY CHEST WITH CONTRAST TECHNIQUE: Multidetector CT imaging of the chest was performed using the standard protocol during bolus administration of intravenous contrast. Multiplanar CT image reconstructions and MIPs were obtained to evaluate the vascular anatomy. CONTRAST:  90 mL Isovue 370 IV. COMPARISON:  CT abdomen/pelvis 11/05/2016 FINDINGS: Cardiovascular: Heart is normal size. Mild calcified plaque over the thoracic aorta. No evidence of pulmonary embolism. Mediastinum/Nodes: No hilar or mediastinal adenopathy. Remaining mediastinal structures are unremarkable. Lungs/Pleura: Lungs are well inflated without focal consolidation or effusion. There is focal fatty pleural thickening along  the wall lateral right lower thorax unchanged. Airways are normal. Upper Abdomen: Couple liver cysts unchanged with the larger measuring 3.2 cm. 2.2 cm gallstone. Musculoskeletal: Degenerative change of the spine. Review of the MIP images confirms the above findings. IMPRESSION: No evidence of pulmonary embolism. No acute cardiopulmonary disease. 2.2 cm gallstone. Two liver cysts unchanged. Aortic Atherosclerosis (ICD10-I70.0). Electronically Signed   By: Marin Olp M.D.   On: 01/09/2017 21:39    EKG: Independently reviewed.  A. fib with RVR.  Assessment/Plan Principal Problem:   Acute GI bleeding Active Problems:   Unspecified atrial fibrillation (HCC)   Acute blood loss anemia    1. Acute GI bleeding -given the melanotic stool likely upper GI bleed.  Cause not clear.  Patient is placed on Protonix infusion and since patient was symptomatic already 2 units of PRBC has been ordered.  Dr. Penelope Coop on-call gastroenterology has been notified and will see patient in consult.  Patient will be kept n.p.o. in anticipation of EGD. 2. Acute blood loss anemia -secondary to GI bleed follow CBC after transfusion. 3. A. fib with RVR -improved with fluids.  Rate is around 90 bpm now.  Xarelto will be on hold due to GI bleed. 4. Obstructive uropathy with recent stone extraction on Foley catheter -will need follow-up with urologist.   DVT prophylaxis: SCDs. Code Status: Full code. Family Communication: Discussed with patient. Disposition Plan: Home. Consults called: Copywriter, advertising. Admission status: Inpatient.   Rise Patience MD [facility] Pager [phone].  If 7PM-7AM, please contact night-coverage [email or web address] Password [email or web address]  01/09/2017, 10:35 PM

## 2017-01-09 NOTE — ED Notes (Signed)
Patient can go up at 2305

## 2017-01-09 NOTE — ED Notes (Signed)
1st set BLOOD CULTURE 5ML/EACH RIGHT AC

## 2017-01-09 NOTE — ED Notes (Signed)
Coming from Urology office, states recent wt loss and new onset of AFIB

## 2017-01-09 NOTE — ED Provider Notes (Signed)
[facility]-ICU/STEPDOWN Provider Note   CSN: [id number] Arrival date & time: 01/09/17  1521     History   Chief Complaint Chief Complaint  Patient presents with  . Fatigue    HPI Johnny Patel is a 83 y.o. male.  Johnny Patel is a 83 y.o. Male who presents to the ED with his wife complaining of feeling fatigued and weak all over for the past several months and worse in the past week. He also reports feeling lightheaded with position change since yesterday. Wife reports she was taking him to a urology follow up appointment today when he felt like he might pass out and felt lightheaded. He had no LOC. He reports diminished appetite for the past several months that is worsened in the past week.  He reports that he has had some V8 juice and Ensure shake.  Wife reports about 25 pound weight loss in the past several months. Patient reports his symptoms began when he had problems with urinary retention this past October.  He was admitted to the hospital with urinary retention and new onset A. fib.  He has been compliant with taking Xarelto.  He later had a scrotal infection and required I&D in the operating room at [facility] about a week and a half ago.  He is followed by urologist Dr. Alyson Ingles.  He reports they had to remove the testicle due to infection.  He denies any scrotal pain or penile pain.  He has a Foley catheter in place.  He denies any changes to his urine or hematuria. Initially, he denies hematochezia, but with further questioning later he does admit to having intermittent black stools for the past week that he attributed to taking antibiotics. He denies fevers, coughing, chest pain, palpitations, abdominal pain, nausea, vomiting, diarrhea, changes to his urination, penile pain, testicular pain, focal weakness, room spinning dizziness, headache, changes to his vision.   The history is provided by the patient and medical records. No language interpreter was used.     Past Medical History:  Diagnosis Date  . Dysrhythmia    atrial fibrillation  . History of kidney stones     Patient Active Problem List   Diagnosis Date Noted  . Acute GI bleeding 01/09/2017  . Acute blood loss anemia 01/09/2017  . Scrotal infection 12/26/2016  . Gross hematuria 11/21/2016  . UTI (urinary tract infection) 11/05/2016  . Hyperglycemia 11/05/2016  . AKI (acute kidney injury) ([place]) 11/05/2016  . Obstructive uropathy 11/05/2016  . Acute pyelonephritis 11/05/2016  . Unspecified atrial fibrillation ([place]) 11/05/2016  . Urinary retention   . ARF (acute renal failure) ([place]) 11/04/2016    Past Surgical History:  Procedure Laterality Date  . CYSTOSCOPY WITH RETROGRADE PYELOGRAM, URETEROSCOPY AND STENT PLACEMENT Right 11/21/2016   Procedure: CYSTOSCOPY WITH RETROGRADE PYELOGRAM, URETEROSCOPY AND STENT PLACEMENT;  Surgeon: Cleon Gustin, MD;  Location: [facility];  Service: Urology;  Laterality: Right;  . HERNIA REPAIR  1974  . HOLMIUM LASER APPLICATION Right 61/09/5091   Procedure: HOLMIUM LASER APPLICATION;  Surgeon: Cleon Gustin, MD;  Location: [facility];  Service: Urology;  Laterality: Right;  . IRRIGATION AND DEBRIDEMENT ABSCESS Right 12/26/2016   Procedure: RIGHT SCROTAL EXPLORATION, RIGHT ORCHIECTOMY;  Surgeon: Lucas Mallow, MD;  Location: [facility];  Service: Urology;  Laterality: Right;  . ORCHIECTOMY Right 12/26/2016   Procedure: POSSIBLE RIGHT ORCHIECTOMY;  Surgeon: Lucas Mallow, MD;  Location: [facility];  Service: Urology;  Laterality: Right;  Home Medications    Prior to Admission medications   Medication Sig Start Date End Date Taking? Authorizing Provider  rivaroxaban (XARELTO) 20 MG TABS tablet Take 1 tablet (20 mg total) daily with supper by mouth. 11/29/16  Yes Fredia Sorrow, MD  sulfamethoxazole-trimethoprim (BACTRIM,SEPTRA) 400-80 MG tablet Take 1 tablet by mouth every 12 (twelve) hours. Patient not taking: Reported on  01/09/2017 12/28/16   Franchot Gallo, MD  tamsulosin (FLOMAX) 0.4 MG CAPS capsule Take 1 capsule (0.4 mg total) by mouth daily after supper. Patient not taking: Reported on 01/09/2017 11/07/16   Eber Jones, MD    Family History Family History  Problem Relation Age of Onset  . Dementia Mother   . Lung cancer Father        smoker  . CAD Father        MI at age 9    Social History Social History   Tobacco Use  . Smoking status: Former Smoker    Types: Cigarettes    Last attempt to quit: 10/23/1970    Years since quitting: 46.2  . Smokeless tobacco: Never Used  Substance Use Topics  . Alcohol use: Yes  . Drug use: No     Allergies   Flomax [tamsulosin hcl]   Review of Systems Review of Systems  Constitutional: Positive for appetite change and fatigue. Negative for chills and fever.  HENT: Negative for congestion and sore throat.   Eyes: Negative for visual disturbance.  Respiratory: Negative for cough, shortness of breath and wheezing.   Cardiovascular: Negative for chest pain and palpitations.  Gastrointestinal: Positive for blood in stool. Negative for abdominal pain, diarrhea, nausea and vomiting.  Genitourinary: Negative for dysuria.  Musculoskeletal: Negative for back pain and neck pain.  Skin: Negative for rash.  Neurological: Positive for syncope (near syncope ) and light-headedness. Negative for dizziness, weakness, numbness and headaches.     Physical Exam Updated Vital Signs BP 97/61   Pulse 95   Temp 98 F (36.7 C) (Oral)   Resp 12   Ht 6' (1.829 m)   Wt 79.5 kg (175 lb 4.3 oz)   SpO2 100%   BMI 23.77 kg/m   Physical Exam  Constitutional: He is oriented to person, place, and time. He appears well-developed and well-nourished. No distress.  HENT:  Head: Normocephalic and atraumatic.  Mouth/Throat: Oropharynx is clear and moist.  Eyes: Conjunctivae are normal. Pupils are equal, round, and reactive to light. Right eye exhibits no  discharge. Left eye exhibits no discharge.  Neck: Neck supple. No JVD present.  Cardiovascular: Normal heart sounds and intact distal pulses. Exam reveals no gallop and no friction rub.  No murmur heard. Irregular regular rhythm.  Heart rate 112.  Pulmonary/Chest: Effort normal and breath sounds normal. No respiratory distress. He has no wheezes. He has no rales.  Lungs are clear to ascultation bilaterally. Symmetric chest expansion bilaterally. No increased work of breathing. No rales or rhonchi.    Abdominal: Soft. There is no tenderness. There is no guarding.  Genitourinary:  Genitourinary Comments: Well-healing incision noted to his right scrotum.  No discharge or erythema.  No tenderness or crepitus to his scrotum.  No penile tenderness.  Foley catheter in place.  Musculoskeletal: He exhibits no edema.  Lymphadenopathy:    He has no cervical adenopathy.  Neurological: He is alert and oriented to person, place, and time. No cranial nerve deficit or sensory deficit. He exhibits normal muscle tone. Coordination normal.  Patient is alert and  oriented 3. Speech is clear and coherent. Cranial nerves are intact. Sensation and strength is intact to bilateral upper and lower extremities. No pronator drift. Finger to nose intact. EOMs intact.   Skin: Skin is warm and dry. Capillary refill takes less than 2 seconds. No rash noted. He is not diaphoretic. No erythema. No pallor.  Psychiatric: He has a normal mood and affect. His behavior is normal.  Nursing note and vitals reviewed.    ED Treatments / Results  Labs (all labs ordered are listed, but only abnormal results are displayed) Labs Reviewed  COMPREHENSIVE METABOLIC PANEL - Abnormal; Notable for the following components:      Result Value   Glucose, Bld 156 (*)    BUN 40 (*)    Total Protein 6.1 (*)    Albumin 3.1 (*)    GFR calc non Af Amer 58 (*)    All other components within normal limits  CBC WITH DIFFERENTIAL/PLATELET -  Abnormal; Notable for the following components:   WBC 18.9 (*)    RBC 2.61 (*)    Hemoglobin 8.1 (*)    HCT 24.0 (*)    Platelets 532 (*)    Neutro Abs 16.2 (*)    All other components within normal limits  URINALYSIS, ROUTINE W REFLEX MICROSCOPIC - Abnormal; Notable for the following components:   APPearance CLOUDY (*)    Hgb urine dipstick LARGE (*)    Leukocytes, UA LARGE (*)    All other components within normal limits  D-DIMER, QUANTITATIVE (NOT AT [facility]) - Abnormal; Notable for the following components:   D-Dimer, Quant 2.79 (*)    All other components within normal limits  GLUCOSE, CAPILLARY - Abnormal; Notable for the following components:   Glucose-Capillary 103 (*)    All other components within normal limits  I-STAT CG4 LACTIC ACID, ED - Abnormal; Notable for the following components:   Lactic Acid, Venous 2.82 (*)    All other components within normal limits  I-STAT CG4 LACTIC ACID, ED - Abnormal; Notable for the following components:   Lactic Acid, Venous 2.14 (*)    All other components within normal limits  POC OCCULT BLOOD, ED - Abnormal; Notable for the following components:   Fecal Occult Bld POSITIVE (*)    All other components within normal limits  MRSA PCR SCREENING  LIPASE, BLOOD  BASIC METABOLIC PANEL  CBC  I-STAT TROPONIN, ED  I-STAT CG4 LACTIC ACID, ED  TYPE AND SCREEN  PREPARE RBC (CROSSMATCH)  ABO/RH    EKG  EKG Interpretation  Date/Time:  Thursday January 09 2017 16:19:52 EST Ventricular Rate:  115 PR Interval:    QRS Duration: 82 QT Interval:  301 QTC Calculation: 417 R Axis:   33 Text Interpretation:  Atrial fibrillation Borderline low voltage, extremity leads Baseline wander in lead(s) V2 No significant change since last tracing Confirmed by Deno Etienne [phone]) on 01/09/2017 5:36:59 PM       Radiology Dg Chest 2 View  Result Date: 01/09/2017 CLINICAL DATA:  Tachycardia, elevated lactate, weight loss EXAM: CHEST  2 VIEW  COMPARISON:  None ; correlation CT abdomen and pelvis 11/05/2016 FINDINGS: Normal heart size, mediastinal contours, and pulmonary vascularity. Mild tortuosity of thoracic aorta. Lungs clear. No pleural effusion or pneumothorax. 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 IMPRESSION: No acute pulmonary  abnormalities. Electronically Signed   By: Lavonia Dana M.D.   On: 01/09/2017 18:46   Ct Angio Chest Pe W And/or Wo Contrast  Result Date: 01/09/2017 CLINICAL DATA:  Weight loss and fatigue with dizziness and difficulty standing along. Intermittent nausea. EXAM: CT ANGIOGRAPHY CHEST WITH CONTRAST TECHNIQUE: Multidetector CT imaging of the chest was performed using the standard protocol during bolus administration of intravenous contrast. Multiplanar CT image reconstructions and MIPs were obtained to evaluate the vascular anatomy. CONTRAST:  90 mL Isovue 370 IV. COMPARISON:  CT abdomen/pelvis 11/05/2016 FINDINGS: Cardiovascular: Heart is normal size. Mild calcified plaque over the thoracic aorta. No evidence of pulmonary embolism. Mediastinum/Nodes: No hilar or mediastinal adenopathy. Remaining mediastinal structures are unremarkable. Lungs/Pleura: Lungs are well inflated without focal consolidation or effusion. There is focal fatty pleural thickening along the wall lateral right lower thorax unchanged. Airways are normal. Upper Abdomen: Couple liver cysts unchanged with the larger measuring 3.2 cm. 2.2 cm gallstone. Musculoskeletal: Degenerative change of the spine. Review of the MIP images confirms the above findings. IMPRESSION: No evidence of pulmonary embolism. No acute cardiopulmonary disease. 2.2 cm gallstone. Two liver cysts unchanged. Aortic Atherosclerosis (ICD10-I70.0). Electronically Signed   By: Marin Olp M.D.   On:  01/09/2017 21:39    Procedures Procedures (including critical care time)  CRITICAL CARE Performed by: Hanley Hays   Total critical care time: 50 minutes  Critical care time was exclusive of separately billable procedures and treating other patients.  Critical care was necessary to treat or prevent imminent or life-threatening deterioration.  Critical care was time spent personally by me on the following activities: development of treatment plan with patient and/or surrogate as well as nursing, discussions with consultants, evaluation of patient's response to treatment, examination of patient, obtaining history from patient or surrogate, ordering and performing treatments and interventions, ordering and review of laboratory studies, ordering and review of radiographic studies, pulse oximetry and re-evaluation of patient's condition.   Medications Ordered in ED Medications  acetaminophen (TYLENOL) tablet 650 mg (not administered)    Or  acetaminophen (TYLENOL) suppository 650 mg (not administered)  ondansetron (ZOFRAN) tablet 4 mg (not administered)    Or  ondansetron (ZOFRAN) injection 4 mg (not administered)  0.9 %  sodium chloride infusion ( Intravenous New Bag/Given 01/10/17 0020)  pantoprazole (PROTONIX) 80 mg in sodium chloride 0.9 % 250 mL (0.32 mg/mL) infusion (8 mg/hr Intravenous New Bag/Given 01/10/17 0020)  pantoprazole (PROTONIX) injection 40 mg (not administered)  sodium chloride 0.9 % bolus 1,000 mL (0 mLs Intravenous Stopped 01/09/17 1811)  sodium chloride 0.9 % bolus 1,000 mL (0 mLs Intravenous Stopped 01/09/17 2041)  pantoprazole (PROTONIX) injection 40 mg (40 mg Intravenous Given 01/09/17 2040)  iopamidol (ISOVUE-370) 76 % injection 100 mL (100 mLs Intravenous Contrast Given 01/09/17 2102)  sodium chloride 0.9 % bolus 1,000 mL (1,000 mLs Intravenous New Bag/Given 01/09/17 2222)     Initial Impression / Assessment and Plan / ED Course  I have reviewed  the triage vital signs and the nursing notes.  Pertinent labs & imaging results that were available during my care of the patient were reviewed by me and considered in my medical decision making (see chart for details).    This is a 83 y.o. Male who presents to the ED with his wife complaining of feeling fatigued and weak all over for the past several months and worse in the past week. He also reports feeling lightheaded with position change since yesterday. Wife reports she was  taking him to a urology follow up appointment today when he felt like he might pass out and felt lightheaded. He had no LOC. He reports diminished appetite for the past several months that is worsened in the past week.  He reports that he has had some V8 juice and Ensure shake.  Wife reports about 25 pound weight loss in the past several months. Patient reports his symptoms began when he had problems with urinary retention this past October.  He was admitted to the hospital with urinary retention and new onset A. fib.  He has been compliant with taking Xarelto.  He later had a scrotal infection and required I&D in the operating room at [facility] about a week and a half ago.  He is followed by urologist Dr. Alyson Ingles.  He reports they had to remove the testicle due to infection.  He denies any scrotal pain or penile pain.  He has a Foley catheter in place.  He denies any changes to his urine or hematuria. Initially, he denies hematochezia, but with further questioning later he does admit to having intermittent black stools for the past week that he attributed to taking antibiotics.  On exam patient is afebrile and nontoxic-appearing.  He is in A. fib on the monitor and tachycardic with a heart rate 112. He is orthostatic. Abdomen is soft and non-tender to palpation. His scrotum shows well healing incision.   After discussion with my attending, will obtain D-dimer. This is positive. Will obtain CTA chest. > Negative for PE.   CBC  shows a leukocytosis with a white count of 18,900.  Hemoglobin is 8.1.  This is a 3-1/2 g drop in 2 weeks.  Digital rectal exam was performed and he has black stool on exam.  Heart rate is improving with fluids.  He is still normotensive.  We will not hesitate to start blood products if needed. Latic is elevated, suspect from dehydration and not sepsis. Will await other tests.   Chest x-ray is unremarkable.  Urinalysis without sign of infection.  Troponin is normal.  Plan for admission.  At reevaluation patient began to be slightly hypotensive with pressures in the 90s over 60s.  Will initiate blood products at this time.  I discussed the risks and benefits of blood administration with the patient.  He agrees with plan for blood.  Will consult for admission and speak with GI.  Blood products ordered.  I consulted with Dr. Hal Hope who accepted the patient for admission.  At consulted with GI Dr. Penelope Coop who reports he will see the patient in the AM in consult.   This patient was discussed with and evaluated by Dr. Tyrone Nine who agrees with assessment and plan.   Final Clinical Impressions(s) / ED Diagnoses   Final diagnoses:  Acute GI bleeding  Orthostatic hypotension  Anticoagulated  Symptomatic anemia    ED Discharge Orders    None       Waynetta Pean, PA-C 01/10/17 [place], [place], DO 01/10/17 1610

## 2017-01-10 ENCOUNTER — Encounter (HOSPITAL_COMMUNITY)
Admission: EM | Disposition: A | Discharge status: Home / Self Care | Payer: Self-pay | Source: Home / Self Care | Attending: Internal Medicine

## 2017-01-10 ENCOUNTER — Inpatient Hospital Stay (HOSPITAL_COMMUNITY): Payer: Medicare Other | Admitting: Anesthesiology

## 2017-01-10 ENCOUNTER — Encounter (HOSPITAL_COMMUNITY): Payer: Self-pay | Admitting: *Deleted

## 2017-01-10 DIAGNOSIS — I4891 Unspecified atrial fibrillation: Secondary | ICD-10-CM

## 2017-01-10 DIAGNOSIS — E43 Unspecified severe protein-calorie malnutrition: Secondary | ICD-10-CM

## 2017-01-10 DIAGNOSIS — Z9289 Personal history of other medical treatment: Secondary | ICD-10-CM

## 2017-01-10 DIAGNOSIS — D649 Anemia, unspecified: Secondary | ICD-10-CM

## 2017-01-10 HISTORY — PX: ESOPHAGOGASTRODUODENOSCOPY: SHX5428

## 2017-01-10 HISTORY — DX: Personal history of other medical treatment: Z92.89

## 2017-01-10 LAB — GLUCOSE, CAPILLARY
GLUCOSE-CAPILLARY: 103 mg/dL — AB (ref 65–99)
GLUCOSE-CAPILLARY: 81 mg/dL (ref 65–99)
Glucose-Capillary: 74 mg/dL (ref 65–99)
Glucose-Capillary: 66 mg/dL (ref 65–99)

## 2017-01-10 LAB — BASIC METABOLIC PANEL
ANION GAP: 8 (ref 5–15)
BUN: 24 mg/dL — AB (ref 6–20)
CALCIUM: 8.2 mg/dL — AB (ref 8.9–10.3)
CO2: 22 mmol/L (ref 22–32)
Chloride: 107 mmol/L (ref 101–111)
Creatinine, Ser: 0.75 mg/dL (ref 0.61–1.24)
GFR calc Af Amer: >60 mL/min (ref >60)
GLUCOSE: 86 mg/dL (ref 65–99)
POTASSIUM: 4 mmol/L (ref 3.5–5.1)
SODIUM: 137 mmol/L (ref 135–145)

## 2017-01-10 LAB — CBC
HEMATOCRIT: 25.2 % — AB (ref 39.0–52.0)
HEMOGLOBIN: 8.8 g/dL — AB (ref 13.0–17.0)
MCH: 32.4 pg (ref 26.0–34.0)
MCHC: 34.9 g/dL (ref 30.0–36.0)
MCV: 92.6 fL (ref 78.0–100.0)
Platelets: 371 10*3/uL (ref 150–400)
RBC: 2.72 MIL/uL — AB (ref 4.22–5.81)
RDW: 14.4 % (ref 11.5–15.5)
WBC: 15.2 10*3/uL — AB (ref 4.0–10.5)

## 2017-01-10 LAB — ABO/RH: ABO/RH(D): A NEG

## 2017-01-10 LAB — MRSA PCR SCREENING: MRSA BY PCR: NEGATIVE

## 2017-01-10 SURGERY — EGD (ESOPHAGOGASTRODUODENOSCOPY)
Anesthesia: Monitor Anesthesia Care

## 2017-01-10 MED ORDER — PEG 3350-KCL-NA BICARB-NACL 420 G PO SOLR
4000.0000 mL | Freq: Once | ORAL | Status: AC
  Administered 2017-01-10: 4000 mL via ORAL
  Filled 2017-01-10: qty 4000

## 2017-01-10 MED ORDER — LACTATED RINGERS IV SOLN
INTRAVENOUS | Status: DC | PRN
  Administered 2017-01-10: 14:00:00 via INTRAVENOUS

## 2017-01-10 MED ORDER — PROPOFOL 500 MG/50ML IV EMUL
INTRAVENOUS | Status: DC | PRN
  Administered 2017-01-10: 40 mg via INTRAVENOUS

## 2017-01-10 MED ORDER — ADULT MULTIVITAMIN W/MINERALS CH
1.0000 | ORAL_TABLET | Freq: Every day | ORAL | Status: DC
  Administered 2017-01-12 – 2017-01-13 (×2): 1 via ORAL
  Filled 2017-01-10 (×2): qty 1

## 2017-01-10 MED ORDER — SODIUM CHLORIDE 0.9 % IV SOLN
INTRAVENOUS | Status: DC
  Administered 2017-01-11: via INTRAVENOUS

## 2017-01-10 MED ORDER — PROPOFOL 500 MG/50ML IV EMUL
INTRAVENOUS | Status: DC | PRN
  Administered 2017-01-10: 100 ug/kg/min via INTRAVENOUS

## 2017-01-10 MED ORDER — PROPOFOL 10 MG/ML IV BOLUS
INTRAVENOUS | Status: AC
  Filled 2017-01-10: qty 40

## 2017-01-10 NOTE — Progress Notes (Signed)
Initial Nutrition Assessment  DOCUMENTATION CODES:   Severe malnutrition in context of acute illness/injury  INTERVENTION:  - Will discuss oral nutrition supplements at follow-up. - Continue to encourage PO intakes.  - Will order daily multivitamin with minerals.   NUTRITION DIAGNOSIS:   Severe Malnutrition related to acute illness(ongoing urology-related issues and current GIB) as evidenced by moderate fat depletion, moderate muscle depletion, percent weight loss.  GOAL:   Patient will meet greater than or equal to 90% of their needs  MONITOR:   PO intake, Weight trends, Labs, I & O's  REASON FOR ASSESSMENT:   Malnutrition Screening Tool  ASSESSMENT:   83 y.o. male with history of recently diagnosed atrial fibrillation and obstructive uropathy who had undergone cystoscopy with stone extraction is on Foley catheter placement had gone to get his urodynamic study done today and the urology office when patient was finding it very weak.  Patient stated over the last 2 days has been feeling dizzy and weak but did not pass out.  Denies any chest pain or shortness of breath but has noticed black stools.  BMI indicates normal weight status. No intakes documented since admission and pt currently NPO pending EGD to evaluate for UGI source of bleeding. Pt seen by this RD earlier in the month. Pt reports intermittent nausea and diarrhea leading to decreases in appetite and desire to eat as well as not being interested in eating. He states that he was drinking 8 juice at home for vitamins and minerals and that he attempted to drink Ensure a few times but cannot get past how much he dislikes the taste; will discuss other oral nutrition supplement options at follow-up. Pt states that the last solid food item he had was 3-4 teaspoons of pinto beans on Tuesday (12/18). He denies abdominal pain or nausea at this time and denies overt abdominal pain PTA.  Pt states that he has lost 25 lbs in the past  2-3 months. Per chart review, pt weighed 194 lbs on 10/18 indicating 19 lb weight (10% body weight) in 2 months. This is significant for time frame.  Medications reviewed. Labs reviewed; CBGs: 103 and 81 mg/dL today, no BMP.  IVF: NS @ 100 mL/hr.     NUTRITION - FOCUSED PHYSICAL EXAM:    Most Recent Value  Orbital Region  No depletion  Upper Arm Region  Moderate depletion  Thoracic and Lumbar Region  Unable to assess  Buccal Region  Mild depletion  Temple Region  Mild depletion  Clavicle Bone Region  Mild depletion  Clavicle and Acromion Bone Region  Moderate depletion  Scapular Bone Region  Unable to assess  Dorsal Hand  Mild depletion  Patellar Region  Mild depletion  Anterior Thigh Region  Moderate depletion  Posterior Calf Region  Moderate depletion  Edema (RD Assessment)  None  Hair  Reviewed  Eyes  Reviewed  Mouth  Reviewed  Skin  Reviewed  Nails  Reviewed       Diet Order:  Diet Heart Room service appropriate? Yes; Fluid consistency: Thin  EDUCATION NEEDS:   No education needs have been identified at this time  Skin:  Skin Assessment: Skin Integrity Issues: Skin Integrity Issues:: Incisions Incisions: R scrotum from 12/6  Last BM:  12/20  Height:   Ht Readings from Last 1 Encounters:  01/09/17 6' (1.829 m)    Weight:   Wt Readings from Last 1 Encounters:  01/09/17 175 lb 4.3 oz (79.5 kg)    Ideal Body Weight:  80.91 kg  BMI:  Body mass index is 23.77 kg/m.  Estimated Nutritional Needs:   Kcal:  1750-1990 (22-25 kcal/kg)  Protein:  65-75 grams  Fluid:  >/= 2 L/day      Jarome Matin, MS, RD, LDN, [facility] Inpatient Clinical Dietitian Pager # [phone] After hours/weekend pager # [phone]

## 2017-01-10 NOTE — H&P (View-Only) (Signed)
[facility] CON[facility] Reason for consult: GI bleeding Referring Physician: [facility] hospitalist.  PCP: None.  Primary GI: None patient signed  Johnny Patel is an 83 y.o. male.  HPI: He states that he has been very healthy up until recently.  He said that he had not seen a doctor in over 40 years until about 3 months ago when he began to develop problems with urinary retention and urinary infections.  He has been seen for obstructive uropathy and is undergone cystoscopy and stone extraction.  In addition to this he developed infection in his right testicle and underwent right orchiectomy about 2 weeks ago.  During 1 of his visits to the emergency room in [place] he was found to be in atrial fibrillation.  The onset of atrial fibrillation was unknown.  He has not seen a cardiologist for this but was started on Xarelto with instructions to see a cardiologist.  He presented to the urology office yesterday for a urodynamic study of his bladder and was quite weak and dizzy and labs revealed a drop in his hemoglobin from 11.62 weeks ago to 8.1.  Patient was tachycardic and hypotensive.  He gave a history of intermittent dark stools for about a month.  He has no history of ulcer disease he has never had EGD or colonoscopy.  He adamantly denies taking any NSAIDs and really has not taken Tylenol except very rarely.  The patient was admitted and has received 2 units of PRBCs.  He is on a Protonix drip.  His last dose of Xarelto was Wednesday night.  He denies abdominal pain or any signs of bright red blood.  His BUN and creatinine are up just a little.  Past Medical History:  Diagnosis Date  . Dysrhythmia    atrial fibrillation  . History of kidney stones     Past Surgical History:  Procedure Laterality Date  . CYSTOSCOPY WITH RETROGRADE PYELOGRAM, URETEROSCOPY AND STENT PLACEMENT Right 11/21/2016   Procedure: CYSTOSCOPY WITH RETROGRADE PYELOGRAM, URETEROSCOPY AND STENT PLACEMENT;  Surgeon: Cleon Gustin, MD;  Location: [facility];  Service: Urology;  Laterality: Right;  . HERNIA REPAIR  1974  . HOLMIUM LASER APPLICATION Right 82/05/35   Procedure: HOLMIUM LASER APPLICATION;  Surgeon: Cleon Gustin, MD;  Location: [facility];  Service: Urology;  Laterality: Right;  . IRRIGATION AND DEBRIDEMENT ABSCESS Right 12/26/2016   Procedure: RIGHT SCROTAL EXPLORATION, RIGHT ORCHIECTOMY;  Surgeon: Lucas Mallow, MD;  Location: [facility];  Service: Urology;  Laterality: Right;  . ORCHIECTOMY Right 12/26/2016   Procedure: POSSIBLE RIGHT ORCHIECTOMY;  Surgeon: Lucas Mallow, MD;  Location: [facility];  Service: Urology;  Laterality: Right;    Family History  Problem Relation Age of Onset  . Dementia Mother   . Lung cancer Father        smoker  . CAD Father        MI at age 27    Social History:  reports that he quit smoking about 46 years ago. His smoking use included cigarettes. he has never used smokeless tobacco. He reports that he drinks alcohol. He reports that he does not use drugs.  Allergies:  Allergies  Allergen Reactions  . Flomax [Tamsulosin Hcl] Nausea Only    Medications; Prior to Admission medications   Medication Sig Start Date End Date Taking? Authorizing Provider  rivaroxaban (XARELTO) 20 MG TABS tablet Take 1 tablet (20 mg total) daily with supper by mouth. 11/29/16  Yes Fredia Sorrow,  MD  sulfamethoxazole-trimethoprim (BACTRIM,SEPTRA) 400-80 MG tablet Take 1 tablet by mouth every 12 (twelve) hours. Patient not taking: Reported on 01/09/2017 12/28/16   Franchot Gallo, MD  tamsulosin (FLOMAX) 0.4 MG CAPS capsule Take 1 capsule (0.4 mg total) by mouth daily after supper. Patient not taking: Reported on 01/09/2017 11/07/16   Eber Jones, MD   . Derrill Memo ON 01/13/2017] pantoprazole  40 mg Intravenous Q12H   PRN Meds acetaminophen **OR** acetaminophen, ondansetron **OR** ondansetron (ZOFRAN) IV Results for orders placed or performed during the hospital  encounter of 01/09/17 (from the past 48 hour(s))  Comprehensive metabolic panel     Status: Abnormal   Collection Time: 01/09/17  5:07 PM  Result Value Ref Range   Sodium 136 135 - 145 mmol/L   Potassium 4.6 3.5 - 5.1 mmol/L   Chloride 105 101 - 111 mmol/L   CO2 22 22 - 32 mmol/L   Glucose, Bld 156 (H) 65 - 99 mg/dL   BUN 40 (H) 6 - 20 mg/dL   Creatinine, Ser 1.13 0.61 - 1.24 mg/dL   Calcium 9.1 8.9 - 10.3 mg/dL   Total Protein 6.1 (L) 6.5 - 8.1 g/dL   Albumin 3.1 (L) 3.5 - 5.0 g/dL   AST 28 15 - 41 U/L   ALT 23 17 - 63 U/L   Alkaline Phosphatase 42 38 - 126 U/L   Total Bilirubin 0.8 0.3 - 1.2 mg/dL   GFR calc non Af Amer 58 (L) >60 mL/min   GFR calc Af Amer >60 >60 mL/min    Comment: (NOTE) The eGFR has been calculated using the CKD EPI equation. This calculation has not been validated in all clinical situations. eGFR's persistently <60 mL/min signify possible Chronic Kidney Disease.    Anion gap 9 5 - 15  Lipase, blood     Status: None   Collection Time: 01/09/17  5:07 PM  Result Value Ref Range   Lipase 26 11 - 51 U/L  CBC with Differential     Status: Abnormal   Collection Time: 01/09/17  5:07 PM  Result Value Ref Range   WBC 18.9 (H) 4.0 - 10.5 K/uL   RBC 2.61 (L) 4.22 - 5.81 MIL/uL   Hemoglobin 8.1 (L) 13.0 - 17.0 g/dL   HCT 24.0 (L) 39.0 - 52.0 %   MCV 92.0 78.0 - 100.0 fL   MCH 31.0 26.0 - 34.0 pg   MCHC 33.8 30.0 - 36.0 g/dL   RDW 14.2 11.5 - 15.5 %   Platelets 532 (H) 150 - 400 K/uL   Neutrophils Relative % 86 %   Neutro Abs 16.2 (H) 1.7 - 7.7 K/uL   Lymphocytes Relative 10 %   Lymphs Abs 2.0 0.7 - 4.0 K/uL   Monocytes Relative 4 %   Monocytes Absolute 0.7 0.1 - 1.0 K/uL   Eosinophils Relative 0 %   Eosinophils Absolute 0.0 0.0 - 0.7 K/uL   Basophils Relative 0 %   Basophils Absolute 0.1 0.0 - 0.1 K/uL  D-dimer, quantitative     Status: Abnormal   Collection Time: 01/09/17  5:07 PM  Result Value Ref Range   D-Dimer, Quant 2.79 (H) 0.00 - 0.50  ug/mL-FEU    Comment: (NOTE) At the manufacturer cut-off of 0.50 ug/mL FEU, this assay has been documented to exclude PE with a sensitivity and negative predictive value of 97 to 99%.  At this time, this assay has not been approved by the FDA to exclude DVT/VTE. Results should be correlated  with clinical presentation.   I-Stat CG4 Lactic Acid, ED     Status: Abnormal   Collection Time: 01/09/17  5:17 PM  Result Value Ref Range   Lactic Acid, Venous 2.82 (HH) 0.5 - 1.9 mmol/L   Comment NOTIFIED PHYSICIAN   Urinalysis, Routine w reflex microscopic     Status: Abnormal   Collection Time: 01/09/17  6:03 PM  Result Value Ref Range   Color, Urine YELLOW YELLOW   APPearance CLOUDY (A) CLEAR   Specific Gravity, Urine 1.025 1.005 - 1.030   pH 5.0 5.0 - 8.0   Glucose, UA NEGATIVE NEGATIVE mg/dL   Hgb urine dipstick LARGE (A) NEGATIVE   Bilirubin Urine NEGATIVE NEGATIVE   Ketones, ur NEGATIVE NEGATIVE mg/dL   Protein, ur NEGATIVE NEGATIVE mg/dL   Nitrite NEGATIVE NEGATIVE   Leukocytes, UA LARGE (A) NEGATIVE   RBC / HPF TOO NUMEROUS TO COUNT 0 - 5 RBC/hpf   WBC, UA 6-30 0 - 5 WBC/hpf   Bacteria, UA NONE SEEN NONE SEEN   Squamous Epithelial / LPF NONE SEEN NONE SEEN   Mucus PRESENT    Hyaline Casts, UA PRESENT    Amorphous Crystal PRESENT   I-stat troponin, ED     Status: None   Collection Time: 01/09/17  6:14 PM  Result Value Ref Range   Troponin i, poc 0.00 0.00 - 0.08 ng/mL   Comment 3            Comment: Due to the release kinetics of cTnI, a negative result within the first hours of the onset of symptoms does not rule out myocardial infarction with certainty. If myocardial infarction is still suspected, repeat the test at appropriate intervals.   I-Stat CG4 Lactic Acid, ED     Status: Abnormal   Collection Time: 01/09/17  6:16 PM  Result Value Ref Range   Lactic Acid, Venous 2.14 (HH) 0.5 - 1.9 mmol/L   Comment NOTIFIED PHYSICIAN   POC occult blood, ED Provider will collect      Status: Abnormal   Collection Time: 01/09/17  7:41 PM  Result Value Ref Range   Fecal Occult Bld POSITIVE (A) NEGATIVE  Prepare RBC     Status: None   Collection Time: 01/09/17 10:18 PM  Result Value Ref Range   Order Confirmation ORDER PROCESSED BY BLOOD BANK   Type and screen [place]     Status: None (Preliminary result)   Collection Time: 01/09/17 10:27 PM  Result Value Ref Range   ABO/RH(D) A NEG    Antibody Screen NEG    Sample Expiration 01/12/2017    Unit Number [id number]    Blood Component Type RBC LR PHER1    Unit division 00    Status of Unit ISSUED    Transfusion Status OK TO TRANSFUSE    Crossmatch Result Compatible    Unit Number [id number]    Blood Component Type RED CELLS,LR    Unit division 00    Status of Unit ISSUED    Transfusion Status OK TO TRANSFUSE    Crossmatch Result Compatible   ABO/Rh     Status: None   Collection Time: 01/09/17 10:27 PM  Result Value Ref Range   ABO/RH(D) A NEG   MRSA PCR Screening     Status: None   Collection Time: 01/09/17 11:16 PM  Result Value Ref Range   MRSA by PCR NEGATIVE NEGATIVE    Comment:        The GeneXpert MRSA  Assay (FDA approved for NASAL specimens only), is one component of a comprehensive MRSA colonization surveillance program. It is not intended to diagnose MRSA infection nor to guide or monitor treatment for MRSA infections.   Glucose, capillary     Status: Abnormal   Collection Time: 01/10/17 12:30 AM  Result Value Ref Range   Glucose-Capillary 103 (H) 65 - 99 mg/dL   Comment 1 Notify RN    Comment 2 Document in Chart   Glucose, capillary     Status: None   Collection Time: 01/10/17  7:34 AM  Result Value Ref Range   Glucose-Capillary 81 65 - 99 mg/dL   Comment 1 Notify RN    Comment 2 Document in Chart     Dg Chest 2 View  Result Date: 01/09/2017 CLINICAL DATA:  Tachycardia, elevated lactate, weight loss EXAM: CHEST  2 VIEW COMPARISON:  None ;  correlation CT abdomen and pelvis 11/05/2016 FINDINGS: Normal heart size, mediastinal contours, and pulmonary vascularity. Mild tortuosity of thoracic aorta. Lungs clear. No pleural effusion or pneumothorax. 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 IMPRESSION: No acute pulmonary abnormalities. Electronically Signed   By: Lavonia Dana M.D.   On: 01/09/2017 18:46   Ct Angio Chest Pe W And/or Wo Contrast  Result Date: 01/09/2017 CLINICAL DATA:  Weight loss and fatigue with dizziness and difficulty standing along. Intermittent nausea. EXAM: CT ANGIOGRAPHY CHEST WITH CONTRAST TECHNIQUE: Multidetector CT imaging of the chest was performed using the standard protocol during bolus administration of intravenous contrast. Multiplanar CT image reconstructions and MIPs were obtained to evaluate the vascular anatomy. CONTRAST:  90 mL Isovue 370 IV. COMPARISON:  CT abdomen/pelvis 11/05/2016 FINDINGS: Cardiovascular: Heart is normal size. Mild calcified plaque over the thoracic aorta. No evidence of pulmonary embolism. Mediastinum/Nodes: No hilar or mediastinal adenopathy. Remaining mediastinal structures are unremarkable. Lungs/Pleura: Lungs are well inflated without focal consolidation or effusion. There is focal fatty pleural thickening along the wall lateral right lower thorax unchanged. Airways are normal. Upper Abdomen: Couple liver cysts unchanged with the larger measuring 3.2 cm. 2.2 cm gallstone. Musculoskeletal: Degenerative change of the spine. Review of the MIP images confirms the above findings. IMPRESSION: No evidence of pulmonary embolism. No acute cardiopulmonary disease. 2.2 cm gallstone. Two liver cysts unchanged. Aortic Atherosclerosis (ICD10-I70.0). Electronically Signed   By: Marin Olp M.D.   On: 01/09/2017 21:39                Blood pressure (!) 100/57, pulse 82, temperature 98.1 F (36.7 C), temperature source Oral, resp. rate 14, height 6' (1.829 m), weight 79.5 kg (175 lb 4.3 oz), SpO2 97 %.  Physical exam:   General--Pleasant white male who appears younger than his stated age ENT--nonicteric Neck--supple with no lymphadenopathy Heart--somewhat irregular without murmurs.  Cardiac monitor shows A. fib with rate in the 90s Lungs--clear Abdomen--soft and completely nontender Psych--alert and oriented answers questions appropriately   Assessment: 1.  GI bleed.  This could be either upper or lower but certainly is exacerbated by his Xarelto.  He will need to be anticoagulated going forward so I think it is important to go ahead and determine where he is bleeding from.  Since he is primarily had melena we will start off with EGD. 2.  Atrial fibrillation with RVR.  This was newly discovered the past  couple months and he is not even seen a cardiologist for workup.  He will likely need to be anticoagulated going forward. 3.  Chronic urologic obstruction.  Has required permanent indwelling Foley catheter with subsequent infections and severe infection of the testicle requiring right orchiectomy due to abscess.  Plan: 1.  We will proceed later today with EGD to evaluate for upper GI source of his bleeding.  Have discussed this with the patient and he is agreeable.  If this is negative I think he should have a colonoscopy while while his Xarelto is on hold.   Nancy Fetter 01/10/2017, 9:23 AM   This note was created using voice recognition software and minor errors may Have occurred unintentionally. Pager: [phone] If no answer or after hours call [phone]

## 2017-01-10 NOTE — Interval H&P Note (Signed)
History and Physical Interval Note:  01/10/2017 1:51 PM  Johnny Patel  has presented today for surgery, with the diagnosis of GI bleed  The various methods of treatment have been discussed with the patient and family. After consideration of risks, benefits and other options for treatment, the patient has consented to  Procedure(s): ESOPHAGOGASTRODUODENOSCOPY (EGD) (N/A) as a surgical intervention .  The patient's history has been reviewed, patient examined, no change in status, stable for surgery.  I have reviewed the patient's chart and labs.  Questions were answered to the patient's satisfaction.     Nancy Fetter

## 2017-01-10 NOTE — Care Management Note (Signed)
Case Management Note  Patient Details  Name: Johnny Patel MRN: [id number] Date of Birth: 1931-09-10  Subjective/Objective:                  Gi rectal bleeding  Action/Plan: Date: January 10, 2017 Velva Harman, BSN, [place], [place] Chart and notes review for patient progress and needs. Will follow for case management and discharge needs. Next review date: [id number]  Expected Discharge Date:                  Expected Discharge Plan:  Home/Self Care  In-House Referral:     Discharge planning Services  CM Consult  Post Acute Care Choice:    Choice offered to:     DME Arranged:    DME Agency:     HH Arranged:    HH Agency:     Status of Service:  In process, will continue to follow  If discussed at Long Length of Stay Meetings, dates discussed:    Additional Comments:  Leeroy Cha, RN 01/10/2017, 8:04 AM

## 2017-01-10 NOTE — Transfer of Care (Signed)
Immediate Anesthesia Transfer of Care Note  Patient: GARISON GENOVA  Procedure(s) Performed: ESOPHAGOGASTRODUODENOSCOPY (EGD) (N/A )  Patient Location: PACU  Anesthesia Type:MAC  Level of Consciousness: awake, alert  and oriented  Airway & Oxygen Therapy: Patient Spontanous Breathing and Patient connected to nasal cannula oxygen  Post-op Assessment: Report given to RN and Post -op Vital signs reviewed and stable  Post vital signs: Reviewed and stable  Last Vitals:  Vitals:   01/10/17 1328 01/10/17 1338  BP: 90/67 (!) 94/58  Pulse: 91 78  Resp: 14 12  Temp: 36.4 C   SpO2: 98% 97%    Last Pain:  Vitals:   01/10/17 1328  TempSrc: Oral  PainSc:          Complications: No apparent anesthesia complications

## 2017-01-10 NOTE — Anesthesia Preprocedure Evaluation (Addendum)
Anesthesia Evaluation  Patient identified by MRN, date of birth, ID band Patient awake    Reviewed: Allergy & Precautions, H&P , NPO status , Patient's Chart, lab work & pertinent test results  History of Anesthesia Complications Negative for: history of anesthetic complications  Airway Mallampati: II  TM Distance: >3 FB Neck ROM: full    Dental  (+) Edentulous Upper, Edentulous Lower   Pulmonary former smoker,    Pulmonary exam normal breath sounds clear to auscultation (-) decreased breath sounds      Cardiovascular + dysrhythmias Atrial Fibrillation  Rhythm:Irregular Rate:Normal  The cavity size was normal. Wall thickness was increased in a pattern of mild LVH. Systolic function was normal. The estimated ejection fraction was in the range of 55% to 60%.    Neuro/Psych negative neurological ROS  negative psych ROS   GI/Hepatic negative GI ROS, Neg liver ROS,   Endo/Other  negative endocrine ROS  Renal/GU      Musculoskeletal negative musculoskeletal ROS (+)   Abdominal Normal abdominal exam  (+)   Peds  Hematology  (+) Blood dyscrasia, anemia ,   Anesthesia Other Findings   Reproductive/Obstetrics                            Anesthesia Physical  Anesthesia Plan  ASA: III and emergent  Anesthesia Plan: MAC   Post-op Pain Management:    Induction: Intravenous  PONV Risk Score and Plan: 1 and Ondansetron and Treatment may vary due to age or medical condition  Airway Management Planned: Natural Airway and Mask  Additional Equipment:   Intra-op Plan:   Post-operative Plan:   Informed Consent: I have reviewed the patients History and Physical, chart, labs and discussed the procedure including the risks, benefits and alternatives for the proposed anesthesia with the patient or authorized representative who has indicated his/her understanding and acceptance.     Plan Discussed  with: CRNA and Surgeon  Anesthesia Plan Comments: (  )        Anesthesia Quick Evaluation

## 2017-01-10 NOTE — Anesthesia Procedure Notes (Signed)
Date/Time: 01/10/2017 1:58 PM Performed by: Glory Buff, CRNA Oxygen Delivery Method: Nasal cannula

## 2017-01-10 NOTE — Progress Notes (Signed)
[facility]  PROGRESS NOTE  Johnny Patel HFW:[id number] DOB: [birth year] DOA: 01/09/2017 PCP: Patient, No Pcp Per   Brief HPI:    83 y.o. male with history of recently diagnosed atrial fibrillation and obstructive uropathy who had undergone cystoscopy with stone extraction is on Foley catheter placement had gone to get his urodynamic study done today and the urology office when patient was finding it very weak Patient came to the ER and was found to be having low normal blood pressure lactate was elevated hemoglobin had dropped from 11 g to 8 g. FOBT was positive.Patient was given fluid bolus and started on 2 units of PRBC transfusion.  On-call gastroenterologist Dr. Bobbe Medico has been consulted and patient is placed on Protonix drip.    Subjective   Patient seen and examined, denies abdominal pain. No nausea vomiting.   Assessment/Plan:     1. Acute GI bleed- patient has melena, will need EGD today. GI has been consulted. Continue Protonix infusion 2. Acute blood loss anemia- secondary to GI bleed as above. Patient is status post 2 units PRBC. Hemoglobin is 8.8 this morning. Follow CBC in a.m. 3. Atrial fibrillation with RVR- patient heart rate is now controlled. Patient has been on anti-coagulation with Xarelto which is on hold  due to GI bleed. 4. Obstructive uropathy- recent stone extraction, on Foley catheter. Will follow with urologist as outpatient. Flomax on hold.    DVT prophylaxis: SCDs  Code Status: Full code  Family Communication: No family present at bedside.   Disposition Plan: Pending GI evaluation   Consultants:  Gastroenterology  Procedures:  None  Continuous infusions . sodium chloride 100 mL/hr at 01/10/17 0600  . pantoprozole (PROTONIX) infusion 8 mg/hr (01/10/17 0848)    Antibiotics:   Anti-infectives (From admission, onward)   None       Objective   Vitals:   01/10/17 0725 01/10/17 0800 01/10/17 0815 01/10/17 1145  BP:  99/72 (!)  100/57   Pulse:      Resp:  14 14   Temp: 97.9 F (36.6 C)  98.1 F (36.7 C) 97.8 F (36.6 C)  TempSrc: Oral  Oral Oral  SpO2:  97%    Weight:      Height:        Intake/Output Summary (Last 24 hours) at 01/10/2017 1219 Last data filed at 01/10/2017 0848 Gross per 24 hour  Intake 4330.02 ml  Output 400 ml  Net 3930.02 ml   Filed Weights   01/09/17 2326  Weight: 79.5 kg (175 lb 4.3 oz)     Physical Examination:  Physical Exam: Eyes: No icterus, extraocular muscles intact  Mouth: Oral mucosa is moist, no lesions on palate,  Neck: Supple, no deformities, masses, or tenderness Lungs: Normal respiratory effort, bilateral clear to auscultation, no crackles or wheezes.  Heart: Regular rate and rhythm, S1 and S2 normal, no murmurs, rubs auscultated Abdomen: BS normoactive,soft,nondistended,non-tender to palpation,no organomegaly Extremities: No pretibial edema, no erythema, no cyanosis, no clubbing Neuro : Alert and oriented to time, place and person, No focal deficits Skin: No rashes seen on exam    Data Reviewed: I have personally reviewed following labs and imaging studies  CBG: Recent Labs  Lab 01/10/17 0030 01/10/17 0734  GLUCAP 103* 81    CBC: Recent Labs  Lab 01/09/17 1707 01/10/17 1043  WBC 18.9* 15.2*  NEUTROABS 16.2*  --   HGB 8.1* 8.8*  HCT 24.0* 25.2*  MCV 92.0 92.6  PLT 532* 371  Basic Metabolic Panel: Recent Labs  Lab 01/09/17 1707 01/10/17 1043  NA 136 137  K 4.6 4.0  CL 105 107  CO2 22 22  GLUCOSE 156* 86  BUN 40* 24*  CREATININE 1.13 0.75  CALCIUM 9.1 8.2*    Recent Results (from the past 240 hour(s))  MRSA PCR Screening     Status: None   Collection Time: 01/09/17 11:16 PM  Result Value Ref Range Status   MRSA by PCR NEGATIVE NEGATIVE Final    Comment:        The GeneXpert MRSA Assay (FDA approved for NASAL specimens only), is one component of a comprehensive MRSA colonization surveillance program. It is  not intended to diagnose MRSA infection nor to guide or monitor treatment for MRSA infections.      Liver Function Tests: Recent Labs  Lab 01/09/17 1707  AST 28  ALT 23  ALKPHOS 42  BILITOT 0.8  PROT 6.1*  ALBUMIN 3.1*   Recent Labs  Lab 01/09/17 1707  LIPASE 26   No results for input(s): AMMONIA in the last 168 hours.  Cardiac Enzymes: No results for input(s): CKTOTAL, CKMB, CKMBINDEX, TROPONINI in the last 168 hours. BNP (last 3 results) No results for input(s): BNP in the last 8760 hours.  ProBNP (last 3 results) No results for input(s): PROBNP in the last 8760 hours.    Studies: Dg Chest 2 View  Result Date: 01/09/2017 CLINICAL DATA:  Tachycardia, elevated lactate, weight loss EXAM: CHEST  2 VIEW COMPARISON:  None ; correlation CT abdomen and pelvis 11/05/2016 FINDINGS: Normal heart size, mediastinal contours, and pulmonary vascularity. Mild tortuosity of thoracic aorta. Lungs clear. No pleural effusion or pneumothorax. 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 IMPRESSION: No acute pulmonary abnormalities. Electronically Signed   By: Lavonia Dana M.D.   On: 01/09/2017 18:46   Ct Angio Chest Pe W And/or Wo Contrast  Result Date: 01/09/2017 CLINICAL DATA:  Weight loss and fatigue with dizziness and difficulty standing along. Intermittent nausea. EXAM: CT ANGIOGRAPHY CHEST WITH CONTRAST TECHNIQUE: Multidetector CT imaging of the chest was performed using the standard protocol during bolus administration of intravenous contrast. Multiplanar CT image reconstructions and MIPs were obtained to evaluate the vascular anatomy. CONTRAST:  90 mL Isovue 370 IV. COMPARISON:  CT abdomen/pelvis 11/05/2016 FINDINGS: Cardiovascular: Heart is normal size. Mild calcified plaque over the thoracic aorta. No  evidence of pulmonary embolism. Mediastinum/Nodes: No hilar or mediastinal adenopathy. Remaining mediastinal structures are unremarkable. Lungs/Pleura: Lungs are well inflated without focal consolidation or effusion. There is focal fatty pleural thickening along the wall lateral right lower thorax unchanged. Airways are normal. Upper Abdomen: Couple liver cysts unchanged with the larger measuring 3.2 cm. 2.2 cm gallstone. Musculoskeletal: Degenerative change of the spine. Review of the MIP images confirms the above findings. IMPRESSION: No evidence of pulmonary embolism. No acute cardiopulmonary disease. 2.2 cm gallstone. Two liver cysts unchanged. Aortic Atherosclerosis (ICD10-I70.0). Electronically Signed   By: Marin Olp M.D.   On: 01/09/2017 21:39    Scheduled Meds: . multivitamin with minerals  1 tablet Oral Daily  . [START ON 01/13/2017] pantoprazole  40 mg Intravenous Q12H      Time spent: 25 min  [facility] Pager [phone]. If 7PM-7AM, please contact night-coverage at [email or web address], Office  [phone]  password  TRH1  01/10/2017, 12:19 PM  LOS: 1 day

## 2017-01-10 NOTE — Consult Note (Signed)
[facility] CONSULT Reason for consult: GI bleeding Referring Physician: [facility].  PCP: None.  Primary GI: None patient signed  Johnny Patel is an 83 y.o. male.  HPI: He states that he has been very healthy up until recently.  He said that he had not seen a doctor in over 40 years until about 3 months ago when he began to develop problems with urinary retention and urinary infections.  He has been seen for obstructive uropathy and is undergone cystoscopy and stone extraction.  In addition to this he developed infection in his right testicle and underwent right orchiectomy about 2 weeks ago.  During 1 of his visits to the emergency room in [place] he was found to be in atrial fibrillation.  The onset of atrial fibrillation was unknown.  He has not seen a cardiologist for this but was started on Xarelto with instructions to see a cardiologist.  He presented to the urology office yesterday for a urodynamic study of his bladder and was quite weak and dizzy and labs revealed a drop in his hemoglobin from 11.62 weeks ago to 8.1.  Patient was tachycardic and hypotensive.  He gave a history of intermittent dark stools for about a month.  He has no history of ulcer disease he has never had EGD or colonoscopy.  He adamantly denies taking any NSAIDs and really has not taken Tylenol except very rarely.  The patient was admitted and has received 2 units of PRBCs.  He is on a Protonix drip.  His last dose of Xarelto was Wednesday night.  He denies abdominal pain or any signs of bright red blood.  His BUN and creatinine are up just a little.  Past Medical History:  Diagnosis Date  . Dysrhythmia    atrial fibrillation  . History of kidney stones     Past Surgical History:  Procedure Laterality Date  . CYSTOSCOPY WITH RETROGRADE PYELOGRAM, URETEROSCOPY AND STENT PLACEMENT Right 11/21/2016   Procedure: CYSTOSCOPY WITH RETROGRADE PYELOGRAM, URETEROSCOPY AND STENT PLACEMENT;  Surgeon: Cleon Gustin, MD;  Location: [facility];  Service: Urology;  Laterality: Right;  . HERNIA REPAIR  1974  . HOLMIUM LASER APPLICATION Right 19/03/7900   Procedure: HOLMIUM LASER APPLICATION;  Surgeon: Cleon Gustin, MD;  Location: [facility];  Service: Urology;  Laterality: Right;  . IRRIGATION AND DEBRIDEMENT ABSCESS Right 12/26/2016   Procedure: RIGHT SCROTAL EXPLORATION, RIGHT ORCHIECTOMY;  Surgeon: Lucas Mallow, MD;  Location: [facility];  Service: Urology;  Laterality: Right;  . ORCHIECTOMY Right 12/26/2016   Procedure: POSSIBLE RIGHT ORCHIECTOMY;  Surgeon: Lucas Mallow, MD;  Location: [facility];  Service: Urology;  Laterality: Right;    Family History  Problem Relation Age of Onset  . Dementia Mother   . Lung cancer Father        smoker  . CAD Father        MI at age 70    Social History:  reports that he quit smoking about 46 years ago. His smoking use included cigarettes. he has never used smokeless tobacco. He reports that he drinks alcohol. He reports that he does not use drugs.  Allergies:  Allergies  Allergen Reactions  . Flomax [Tamsulosin Hcl] Nausea Only    Medications; Prior to Admission medications   Medication Sig Start Date End Date Taking? Authorizing Provider  rivaroxaban (XARELTO) 20 MG TABS tablet Take 1 tablet (20 mg total) daily with supper by mouth. 11/29/16  Yes Fredia Sorrow,  MD  sulfamethoxazole-trimethoprim (BACTRIM,SEPTRA) 400-80 MG tablet Take 1 tablet by mouth every 12 (twelve) hours. Patient not taking: Reported on 01/09/2017 12/28/16   Franchot Gallo, MD  tamsulosin (FLOMAX) 0.4 MG CAPS capsule Take 1 capsule (0.4 mg total) by mouth daily after supper. Patient not taking: Reported on 01/09/2017 11/07/16   Eber Jones, MD   . Derrill Memo ON 01/13/2017] pantoprazole  40 mg Intravenous Q12H   PRN Meds acetaminophen **OR** acetaminophen, ondansetron **OR** ondansetron (ZOFRAN) IV Results for orders placed or performed during the hospital  encounter of 01/09/17 (from the past 48 hour(s))  Comprehensive metabolic panel     Status: Abnormal   Collection Time: 01/09/17  5:07 PM  Result Value Ref Range   Sodium 136 135 - 145 mmol/L   Potassium 4.6 3.5 - 5.1 mmol/L   Chloride 105 101 - 111 mmol/L   CO2 22 22 - 32 mmol/L   Glucose, Bld 156 (H) 65 - 99 mg/dL   BUN 40 (H) 6 - 20 mg/dL   Creatinine, Ser 1.13 0.61 - 1.24 mg/dL   Calcium 9.1 8.9 - 10.3 mg/dL   Total Protein 6.1 (L) 6.5 - 8.1 g/dL   Albumin 3.1 (L) 3.5 - 5.0 g/dL   AST 28 15 - 41 U/L   ALT 23 17 - 63 U/L   Alkaline Phosphatase 42 38 - 126 U/L   Total Bilirubin 0.8 0.3 - 1.2 mg/dL   GFR calc non Af Amer 58 (L) >60 mL/min   GFR calc Af Amer >60 >60 mL/min    Comment: (NOTE) The eGFR has been calculated using the CKD EPI equation. This calculation has not been validated in all clinical situations. eGFR's persistently <60 mL/min signify possible Chronic Kidney Disease.    Anion gap 9 5 - 15  Lipase, blood     Status: None   Collection Time: 01/09/17  5:07 PM  Result Value Ref Range   Lipase 26 11 - 51 U/L  CBC with Differential     Status: Abnormal   Collection Time: 01/09/17  5:07 PM  Result Value Ref Range   WBC 18.9 (H) 4.0 - 10.5 K/uL   RBC 2.61 (L) 4.22 - 5.81 MIL/uL   Hemoglobin 8.1 (L) 13.0 - 17.0 g/dL   HCT 24.0 (L) 39.0 - 52.0 %   MCV 92.0 78.0 - 100.0 fL   MCH 31.0 26.0 - 34.0 pg   MCHC 33.8 30.0 - 36.0 g/dL   RDW 14.2 11.5 - 15.5 %   Platelets 532 (H) 150 - 400 K/uL   Neutrophils Relative % 86 %   Neutro Abs 16.2 (H) 1.7 - 7.7 K/uL   Lymphocytes Relative 10 %   Lymphs Abs 2.0 0.7 - 4.0 K/uL   Monocytes Relative 4 %   Monocytes Absolute 0.7 0.1 - 1.0 K/uL   Eosinophils Relative 0 %   Eosinophils Absolute 0.0 0.0 - 0.7 K/uL   Basophils Relative 0 %   Basophils Absolute 0.1 0.0 - 0.1 K/uL  D-dimer, quantitative     Status: Abnormal   Collection Time: 01/09/17  5:07 PM  Result Value Ref Range   D-Dimer, Quant 2.79 (H) 0.00 - 0.50  ug/mL-FEU    Comment: (NOTE) At the manufacturer cut-off of 0.50 ug/mL FEU, this assay has been documented to exclude PE with a sensitivity and negative predictive value of 97 to 99%.  At this time, this assay has not been approved by the FDA to exclude DVT/VTE. Results should be correlated  with clinical presentation.   I-Stat CG4 Lactic Acid, ED     Status: Abnormal   Collection Time: 01/09/17  5:17 PM  Result Value Ref Range   Lactic Acid, Venous 2.82 (HH) 0.5 - 1.9 mmol/L   Comment NOTIFIED PHYSICIAN   Urinalysis, Routine w reflex microscopic     Status: Abnormal   Collection Time: 01/09/17  6:03 PM  Result Value Ref Range   Color, Urine YELLOW YELLOW   APPearance CLOUDY (A) CLEAR   Specific Gravity, Urine 1.025 1.005 - 1.030   pH 5.0 5.0 - 8.0   Glucose, UA NEGATIVE NEGATIVE mg/dL   Hgb urine dipstick LARGE (A) NEGATIVE   Bilirubin Urine NEGATIVE NEGATIVE   Ketones, ur NEGATIVE NEGATIVE mg/dL   Protein, ur NEGATIVE NEGATIVE mg/dL   Nitrite NEGATIVE NEGATIVE   Leukocytes, UA LARGE (A) NEGATIVE   RBC / HPF TOO NUMEROUS TO COUNT 0 - 5 RBC/hpf   WBC, UA 6-30 0 - 5 WBC/hpf   Bacteria, UA NONE SEEN NONE SEEN   Squamous Epithelial / LPF NONE SEEN NONE SEEN   Mucus PRESENT    Hyaline Casts, UA PRESENT    Amorphous Crystal PRESENT   I-stat troponin, ED     Status: None   Collection Time: 01/09/17  6:14 PM  Result Value Ref Range   Troponin i, poc 0.00 0.00 - 0.08 ng/mL   Comment 3            Comment: Due to the release kinetics of cTnI, a negative result within the first hours of the onset of symptoms does not rule out myocardial infarction with certainty. If myocardial infarction is still suspected, repeat the test at appropriate intervals.   I-Stat CG4 Lactic Acid, ED     Status: Abnormal   Collection Time: 01/09/17  6:16 PM  Result Value Ref Range   Lactic Acid, Venous 2.14 (HH) 0.5 - 1.9 mmol/L   Comment NOTIFIED PHYSICIAN   POC occult blood, ED Provider will collect      Status: Abnormal   Collection Time: 01/09/17  7:41 PM  Result Value Ref Range   Fecal Occult Bld POSITIVE (A) NEGATIVE  Prepare RBC     Status: None   Collection Time: 01/09/17 10:18 PM  Result Value Ref Range   Order Confirmation ORDER PROCESSED BY BLOOD BANK   Type and screen [place]     Status: None (Preliminary result)   Collection Time: 01/09/17 10:27 PM  Result Value Ref Range   ABO/RH(D) A NEG    Antibody Screen NEG    Sample Expiration 01/12/2017    Unit Number [id number]    Blood Component Type RBC LR PHER1    Unit division 00    Status of Unit ISSUED    Transfusion Status OK TO TRANSFUSE    Crossmatch Result Compatible    Unit Number [id number]    Blood Component Type RED CELLS,LR    Unit division 00    Status of Unit ISSUED    Transfusion Status OK TO TRANSFUSE    Crossmatch Result Compatible   ABO/Rh     Status: None   Collection Time: 01/09/17 10:27 PM  Result Value Ref Range   ABO/RH(D) A NEG   MRSA PCR Screening     Status: None   Collection Time: 01/09/17 11:16 PM  Result Value Ref Range   MRSA by PCR NEGATIVE NEGATIVE    Comment:        The GeneXpert MRSA  Assay (FDA approved for NASAL specimens only), is one component of a comprehensive MRSA colonization surveillance program. It is not intended to diagnose MRSA infection nor to guide or monitor treatment for MRSA infections.   Glucose, capillary     Status: Abnormal   Collection Time: 01/10/17 12:30 AM  Result Value Ref Range   Glucose-Capillary 103 (H) 65 - 99 mg/dL   Comment 1 Notify RN    Comment 2 Document in Chart   Glucose, capillary     Status: None   Collection Time: 01/10/17  7:34 AM  Result Value Ref Range   Glucose-Capillary 81 65 - 99 mg/dL   Comment 1 Notify RN    Comment 2 Document in Chart     Dg Chest 2 View  Result Date: 01/09/2017 CLINICAL DATA:  Tachycardia, elevated lactate, weight loss EXAM: CHEST  2 VIEW COMPARISON:  None ;  correlation CT abdomen and pelvis 11/05/2016 FINDINGS: Normal heart size, mediastinal contours, and pulmonary vascularity. Mild tortuosity of thoracic aorta. Lungs clear. No pleural effusion or pneumothorax. 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 IMPRESSION: No acute pulmonary abnormalities. Electronically Signed   By: Lavonia Dana M.D.   On: 01/09/2017 18:46   Ct Angio Chest Pe W And/or Wo Contrast  Result Date: 01/09/2017 CLINICAL DATA:  Weight loss and fatigue with dizziness and difficulty standing along. Intermittent nausea. EXAM: CT ANGIOGRAPHY CHEST WITH CONTRAST TECHNIQUE: Multidetector CT imaging of the chest was performed using the standard protocol during bolus administration of intravenous contrast. Multiplanar CT image reconstructions and MIPs were obtained to evaluate the vascular anatomy. CONTRAST:  90 mL Isovue 370 IV. COMPARISON:  CT abdomen/pelvis 11/05/2016 FINDINGS: Cardiovascular: Heart is normal size. Mild calcified plaque over the thoracic aorta. No evidence of pulmonary embolism. Mediastinum/Nodes: No hilar or mediastinal adenopathy. Remaining mediastinal structures are unremarkable. Lungs/Pleura: Lungs are well inflated without focal consolidation or effusion. There is focal fatty pleural thickening along the wall lateral right lower thorax unchanged. Airways are normal. Upper Abdomen: Couple liver cysts unchanged with the larger measuring 3.2 cm. 2.2 cm gallstone. Musculoskeletal: Degenerative change of the spine. Review of the MIP images confirms the above findings. IMPRESSION: No evidence of pulmonary embolism. No acute cardiopulmonary disease. 2.2 cm gallstone. Two liver cysts unchanged. Aortic Atherosclerosis (ICD10-I70.0). Electronically Signed   By: Marin Olp M.D.   On: 01/09/2017 21:39                Blood pressure (!) 100/57, pulse 82, temperature 98.1 F (36.7 C), temperature source Oral, resp. rate 14, height 6' (1.829 m), weight 79.5 kg (175 lb 4.3 oz), SpO2 97 %.  Physical exam:   General--Pleasant white male who appears younger than his stated age ENT--nonicteric Neck--supple with no lymphadenopathy Heart--somewhat irregular without murmurs.  Cardiac monitor shows A. fib with rate in the 90s Lungs--clear Abdomen--soft and completely nontender Psych--alert and oriented answers questions appropriately   Assessment: 1.  GI bleed.  This could be either upper or lower but certainly is exacerbated by his Xarelto.  He will need to be anticoagulated going forward so I think it is important to go ahead and determine where he is bleeding from.  Since he is primarily had melena we will start off with EGD. 2.  Atrial fibrillation with RVR.  This was newly discovered the past  couple months and he is not even seen a cardiologist for workup.  He will likely need to be anticoagulated going forward. 3.  Chronic urologic obstruction.  Has required permanent indwelling Foley catheter with subsequent infections and severe infection of the testicle requiring right orchiectomy due to abscess.  Plan: 1.  We will proceed later today with EGD to evaluate for upper GI source of his bleeding.  Have discussed this with the patient and he is agreeable.  If this is negative I think he should have a colonoscopy while while his Xarelto is on hold.   Nancy Fetter 01/10/2017, 9:23 AM   This note was created using voice recognition software and minor errors may Have occurred unintentionally. Pager: [phone] If no answer or after hours call [phone]

## 2017-01-10 NOTE — Anesthesia Postprocedure Evaluation (Signed)
Anesthesia Post Note  Patient: Johnny Patel  Procedure(s) Performed: ESOPHAGOGASTRODUODENOSCOPY (EGD) (N/A )     Patient location during evaluation: Endoscopy Anesthesia Type: MAC Level of consciousness: awake and sedated Pain management: pain level controlled Vital Signs Assessment: post-procedure vital signs reviewed and stable Respiratory status: spontaneous breathing Cardiovascular status: stable Postop Assessment: no apparent nausea or vomiting Anesthetic complications: no    Last Vitals:  Vitals:   01/10/17 1426 01/10/17 1430  BP: (!) 85/57 92/60  Pulse: 88 77  Resp: (!) 21 (!) 28  Temp: 36.6 C   SpO2: 99% 99%    Last Pain:  Vitals:   01/10/17 1426  TempSrc: Oral  PainSc:    Pain Goal:                 Shalev Helminiak JR,JOHN Kalman Nylen

## 2017-01-10 NOTE — Op Note (Signed)
[facility] Patient Name: Johnny Patel Procedure Date: 01/10/2017 MRN: [id number] Attending MD: Nancy Fetter Dr., MD Date of Birth: 05-18-1931 CSN: [id number] Age: 83 Admit Type: Inpatient Procedure:                Upper GI endoscopy Indications:              Melena Providers:                Jeneen Rinks L. Tava Peery Dr., MD, Zenon Mayo, RN, Tinnie Gens, Technician, Rosario Adie, CRNA Referring MD:              Medicines:                Monitored Anesthesia Care Complications:            No immediate complications. Estimated Blood Loss:     Estimated blood loss: none. Procedure:                Pre-Anesthesia Assessment:                           - Prior to the procedure, a History and Physical                            was performed, and patient medications and                            allergies were reviewed. The patient's tolerance of                            previous anesthesia was also reviewed. The risks                            and benefits of the procedure and the sedation                            options and risks were discussed with the patient.                            All questions were answered, and informed consent                            was obtained. Prior Anticoagulants: The patient has                            taken Xarelto (rivaroxaban), last dose was 2 days                            prior to procedure. ASA Grade Assessment: III - A                            patient with severe systemic disease. After  reviewing the risks and benefits, the patient was                            deemed in satisfactory condition to undergo the                            procedure.                           After obtaining informed consent, the endoscope was                            passed under direct vision. Throughout the                            procedure, the patient's blood pressure,  pulse, and                            oxygen saturations were monitored continuously. The                            EG-[id number] ([id number]) scope was introduced through the                            mouth, and advanced to the second part of duodenum.                            The upper GI endoscopy was accomplished without                            difficulty. The patient tolerated the procedure                            well. Scope In: Scope Out: Findings:      The esophagus was normal.      The stomach was normal.      A few 2 to 6 mm mucosal nodules with a diffuse distribution were found       in the duodenal bulb. Biopsies were taken with a cold forceps for       histology. 's largest nodule was the only one biopsied and it was       approximately 6 mm in diameter located in the posterior portion of the       bulb. Impression:               - Normal esophagus.                           - Normal stomach.                           - Mucosal nodule found in the duodenum. Biopsied. Moderate Sedation:      See anesthesia note, no moderate sedation. Recommendation:           - Return patient to hospital ward for ongoing care.                           -  Perform a colonoscopy tomorrow.                           - Put patient on a clear liquid diet starting today.                           - Continue present medications. Procedure Code(s):        --- Professional ---                           [phone], Esophagogastroduodenoscopy, flexible,                            transoral; with biopsy, single or multiple Diagnosis Code(s):        --- Professional ---                           K92.1, Melena (includes Hematochezia)                           K31.89, Other diseases of stomach and duodenum CPT copyright 2016 American [employer] Association. All rights reserved. The codes documented in this report are preliminary and upon coder review may  be revised to meet current compliance  requirements. Nancy Fetter Dr., MD 01/10/2017 2:31:19 PM This report has been signed electronically. Number of Addenda: 0

## 2017-01-11 ENCOUNTER — Inpatient Hospital Stay (HOSPITAL_COMMUNITY): Payer: Medicare Other | Admitting: Anesthesiology

## 2017-01-11 ENCOUNTER — Encounter (HOSPITAL_COMMUNITY)
Admission: EM | Disposition: A | Discharge status: Home / Self Care | Payer: Self-pay | Source: Home / Self Care | Attending: Internal Medicine

## 2017-01-11 ENCOUNTER — Encounter (HOSPITAL_COMMUNITY): Payer: Self-pay

## 2017-01-11 DIAGNOSIS — Z7901 Long term (current) use of anticoagulants: Secondary | ICD-10-CM

## 2017-01-11 HISTORY — PX: COLONOSCOPY: SHX5424

## 2017-01-11 LAB — BASIC METABOLIC PANEL
Anion gap: 8 (ref 5–15)
BUN: 15 mg/dL (ref 6–20)
CALCIUM: 8.5 mg/dL — AB (ref 8.9–10.3)
CO2: 20 mmol/L — ABNORMAL LOW (ref 22–32)
CREATININE: 0.82 mg/dL (ref 0.61–1.24)
Chloride: 108 mmol/L (ref 101–111)
GFR calc non Af Amer: >60 mL/min (ref >60)
Glucose, Bld: 80 mg/dL (ref 65–99)
Potassium: 4.2 mmol/L (ref 3.5–5.1)
SODIUM: 136 mmol/L (ref 135–145)

## 2017-01-11 LAB — TYPE AND SCREEN
ABO/RH(D): A NEG
Antibody Screen: NEGATIVE
Unit division: 0
Unit division: 0

## 2017-01-11 LAB — GLUCOSE, CAPILLARY
GLUCOSE-CAPILLARY: 73 mg/dL (ref 65–99)
GLUCOSE-CAPILLARY: 81 mg/dL (ref 65–99)
Glucose-Capillary: 97 mg/dL (ref 65–99)

## 2017-01-11 LAB — BPAM RBC
BLOOD PRODUCT EXPIRATION DATE: 201901062359
Blood Product Expiration Date: 201901142359
ISSUE DATE / TIME: 201812210115
ISSUE DATE / TIME: 201812210505
UNIT TYPE AND RH: 600
Unit Type and Rh: 600

## 2017-01-11 LAB — CBC
HCT: 29.9 % — ABNORMAL LOW (ref 39.0–52.0)
HEMOGLOBIN: 10.3 g/dL — AB (ref 13.0–17.0)
MCH: 32.2 pg (ref 26.0–34.0)
MCHC: 34.4 g/dL (ref 30.0–36.0)
MCV: 93.4 fL (ref 78.0–100.0)
Platelets: 418 10*3/uL — ABNORMAL HIGH (ref 150–400)
RBC: 3.2 MIL/uL — ABNORMAL LOW (ref 4.22–5.81)
RDW: 14.6 % (ref 11.5–15.5)
WBC: 13 10*3/uL — ABNORMAL HIGH (ref 4.0–10.5)

## 2017-01-11 SURGERY — COLONOSCOPY
Anesthesia: Monitor Anesthesia Care

## 2017-01-11 MED ORDER — PHENYLEPHRINE 40 MCG/ML (10ML) SYRINGE FOR IV PUSH (FOR BLOOD PRESSURE SUPPORT)
PREFILLED_SYRINGE | INTRAVENOUS | Status: DC | PRN
  Administered 2017-01-11 (×4): 80 ug via INTRAVENOUS

## 2017-01-11 MED ORDER — PANTOPRAZOLE SODIUM 40 MG IV SOLR
40.0000 mg | Freq: Every day | INTRAVENOUS | Status: DC
  Administered 2017-01-11 – 2017-01-12 (×2): 40 mg via INTRAVENOUS
  Filled 2017-01-11 (×2): qty 40

## 2017-01-11 MED ORDER — LACTATED RINGERS IV SOLN
INTRAVENOUS | Status: DC | PRN
  Administered 2017-01-11: 09:00:00 via INTRAVENOUS

## 2017-01-11 MED ORDER — ONDANSETRON HCL 4 MG/2ML IJ SOLN: 4.0000 mg | Freq: Once | INTRAMUSCULAR | Status: DC | PRN

## 2017-01-11 MED ORDER — HYDROMORPHONE HCL 1 MG/ML IJ SOLN: 0.2500 mg | INTRAMUSCULAR | Status: DC | PRN

## 2017-01-11 MED ORDER — PROPOFOL 10 MG/ML IV BOLUS
INTRAVENOUS | Status: AC
  Filled 2017-01-11: qty 40

## 2017-01-11 MED ORDER — PROPOFOL 10 MG/ML IV BOLUS
INTRAVENOUS | Status: DC | PRN
  Administered 2017-01-11 (×11): 10 mg via INTRAVENOUS
  Administered 2017-01-11: 30 mg via INTRAVENOUS
  Administered 2017-01-11 (×2): 10 mg via INTRAVENOUS

## 2017-01-11 MED ORDER — MEPERIDINE HCL 25 MG/ML IJ SOLN: 6.2500 mg | INTRAMUSCULAR | Status: DC | PRN

## 2017-01-11 NOTE — Progress Notes (Signed)
Assumed care of patient at 1600. Agree with previous RN assessment.

## 2017-01-11 NOTE — Anesthesia Preprocedure Evaluation (Signed)
Anesthesia Evaluation  Patient identified by MRN, date of birth, ID band Patient awake    Reviewed: Allergy & Precautions, NPO status , Patient's Chart, lab work & pertinent test results  Airway Mallampati: I  TM Distance: >3 FB Neck ROM: Full    Dental   Pulmonary former smoker,    Pulmonary exam normal        Cardiovascular Normal cardiovascular exam+ dysrhythmias Atrial Fibrillation      Neuro/Psych    GI/Hepatic   Endo/Other    Renal/GU      Musculoskeletal   Abdominal   Peds  Hematology   Anesthesia Other Findings   Reproductive/Obstetrics                             Anesthesia Physical Anesthesia Plan  ASA: III  Anesthesia Plan: MAC   Post-op Pain Management:    Induction: Intravenous  PONV Risk Score and Plan: 1 and Treatment may vary due to age or medical condition  Airway Management Planned: Simple Face Mask  Additional Equipment:   Intra-op Plan:   Post-operative Plan:   Informed Consent: I have reviewed the patients History and Physical, chart, labs and discussed the procedure including the risks, benefits and alternatives for the proposed anesthesia with the patient or authorized representative who has indicated his/her understanding and acceptance.     Plan Discussed with: CRNA and Surgeon  Anesthesia Plan Comments:         Anesthesia Quick Evaluation

## 2017-01-11 NOTE — Interval H&P Note (Signed)
History and Physical Interval Note: 85/male with anemia, drop in Hb, unremarkable EGD yesterday, recently started on Xarelto for A fib for a colonoscopy.  01/11/2017 9:22 AM  Johnny Patel  has presented today for colonoscopy, with the diagnosis of GI Bleed  The various methods of treatment have been discussed with the patient and family. After consideration of risks, benefits and other options for treatment, the patient has consented to  Procedure(s): COLONOSCOPY (N/A) as a surgical intervention .  The patient's history has been reviewed, patient examined, no change in status, stable for surgery.  I have reviewed the patient's chart and labs.  Questions were answered to the patient's satisfaction.     Ronnette Juniper

## 2017-01-11 NOTE — Op Note (Signed)
[facility] Patient Name: Johnny Patel Procedure Date: 01/11/2017 MRN: [id number] Attending MD: Ronnette Juniper , MD Date of Birth: Aug 27, 1931 CSN: [id number] Age: 83 Admit Type: Inpatient Procedure:                Colonoscopy Indications:              This is the patient's first colonoscopy,                            Unexplained iron deficiency anemia Providers:                Ronnette Juniper, MD, Laverta Baltimore RN, RN, Elspeth Cho Tech., Technician, Derrek Gu. Alday CRNA,                            CRNA Referring MD:              Medicines:                Monitored Anesthesia Care Complications:            No immediate complications. Estimated Blood Loss:     Estimated blood loss was minimal. Procedure:                Pre-Anesthesia Assessment:                           - Prior to the procedure, a History and Physical                            was performed, and patient medications and                            allergies were reviewed. The patient's tolerance of                            previous anesthesia was also reviewed. The risks                            and benefits of the procedure and the sedation                            options and risks were discussed with the patient.                            All questions were answered, and informed consent                            was obtained. Prior Anticoagulants: The patient has                            taken Xarelto (rivaroxaban), last dose was 3 days                            prior  to procedure. ASA Grade Assessment: III - A                            patient with severe systemic disease. After                            reviewing the risks and benefits, the patient was                            deemed in satisfactory condition to undergo the                            procedure.                           After obtaining informed consent, the colonoscope                             was passed under direct vision. Throughout the                            procedure, the patient's blood pressure, pulse, and                            oxygen saturations were monitored continuously. The                            EC-[id number] ([id number]) scope was introduced through                            the anus and advanced to the the terminal ileum.                            The colonoscopy was performed without difficulty.                            The patient tolerated the procedure well. The                            quality of the bowel preparation was adequate to                            identify polyps 6 mm and larger in size. The                            terminal ileum, ileocecal valve, appendiceal                            orifice, and rectum were photographed. Scope In: 9:34:51 AM Scope Out: 10:02:58 AM Scope Withdrawal Time: 0 hours 12 minutes 14 seconds  Total Procedure Duration: 0 hours 28 minutes 7 seconds  Findings:      The perianal exam findings include non-thrombosed external hemorrhoids.      A patchy area of severely whitish plaque covered mucosa was  found in the       rectum. Biopsies were taken with a cold forceps for histology.      Five sessile polyps were found in the hepatic flexure. The polyps were 5       to 15 mm in size. These polyps were removed with a hot snare and cold       biopsy polypectomy. Resection and retrieval were complete. To stop       active bleeding, two hemostatic clips were successfully placed. There       was no bleeding at the end of the procedure.      A 6 mm polyp was found in the sigmoid colon. The polyp was sessile. The       polyp was removed with a hot snare. Resection and retrieval were       complete.      Multiple small and large-mouthed diverticula were found in the sigmoid       colon and descending colon.      Non-bleeding internal hemorrhoids were found during retroflexion. The       hemorrhoids were  moderate and medium-sized.      The terminal ileum appeared normal. Impression:               - Non-thrombosed external hemorrhoids found on                            perianal exam.                           - Plaque covered mucosa in the rectum. Biopsied.                           - Five 5 to 15 mm polyps at the hepatic flexure,                            removed with a hot snare. Resected and retrieved.                            Clips were placed.                           - One 6 mm polyp in the sigmoid colon, removed with                            a hot snare. Resected and retrieved.                           - Diverticulosis in the sigmoid colon and in the                            descending colon.                           - Non-bleeding internal hemorrhoids.                           - The examined portion of the ileum was normal. Moderate Sedation:      Patient  did not receive moderate sedation for this procedure, but       instead received monitored anesthesia care. Recommendation:           - Resume regular diet.                           - Continue present medications.                           - Await pathology results.                           - Repeat colonoscopy in 1-3 years for surveillance                            based on pathology results.                           - Resume Xarelto (rivaroxaban) at prior dose                            tomorrow. Refer to referring physician for further                            adjustment of therapy.                           - Return patient to hospital ward for ongoing care. Procedure Code(s):        --- Professional ---                           [phone], Colonoscopy, flexible; with removal of                            tumor(s), polyp(s), or other lesion(s) by snare                            technique                           45380, 42, Colonoscopy, flexible; with biopsy,                            single or  multiple Diagnosis Code(s):        --- Professional ---                           K64.4, Residual hemorrhoidal skin tags                           K64.8, Other hemorrhoids                           D12.3, Benign neoplasm of transverse colon (hepatic                            flexure or splenic flexure)  D12.5, Benign neoplasm of sigmoid colon                           D50.9, Iron deficiency anemia, unspecified                           K57.30, Diverticulosis of large intestine without                            perforation or abscess without bleeding CPT copyright 2016 American Medical Association. All rights reserved. The codes documented in this report are preliminary and upon coder review may  be revised to meet current compliance requirements. Ronnette Juniper, MD 01/11/2017 10:21:02 AM This report has been signed electronically. Number of Addenda: 0

## 2017-01-11 NOTE — Op Note (Signed)
Colonoscopy was performed for anemia, FOBT positive stool and acute drop in hemoglobin with unremarkable EGD. Patient has atrial fibrillation and needs anticoagulation.   Colonoscopy showed abnormal rectal mucosa, whitish nodular plaques were noted in the rectum in a patchy fashion, biopsies taken. A small sigmoid polyp was noted and removed via hot snare polypectomy. Diverticulosis was noted in sigmoid and descending. A total of 5 polyps were noted in the hepatic flexure, removed via piecemeal with hot snare polypectomy and cold biopsy forceps polypectomy, 2 endoclips were placed to stop active oozing. Terminal ileum appeared unremarkable. Patient had large external hemorrhoids and small internal hemorrhoids.   Recommend start regular diet Follow up pathology as an outpatient, repeat colonoscopy in 1-3 years depending upon pathology. Resume Xarelto in a.m. as per primary team/cardiology evaluation. If hemoglobin remains stable okay to discharge in a.m.Marland Kitchen  Ronnette Juniper, M.D.

## 2017-01-11 NOTE — Transfer of Care (Signed)
Immediate Anesthesia Transfer of Care Note  Patient: Johnny Patel  Procedure(s) Performed: COLONOSCOPY (N/A )  Patient Location: PACU  Anesthesia Type:MAC  Level of Consciousness: sedated  Airway & Oxygen Therapy: Patient Spontanous Breathing and Patient connected to face mask oxygen  Post-op Assessment: Report given to RN and Post -op Vital signs reviewed and stable  Post vital signs: Reviewed and stable  Last Vitals:  Vitals:   01/11/17 0800 01/11/17 0855  BP: 108/68 117/83  Pulse:  95  Resp: 10 (!) 22  Temp: 36.7 C 36.7 C  SpO2: 95% 97%    Last Pain:  Vitals:   01/11/17 0855  TempSrc: Oral  PainSc:          Complications: No apparent anesthesia complications

## 2017-01-11 NOTE — Anesthesia Postprocedure Evaluation (Signed)
Anesthesia Post Note  Patient: Johnny Patel  Procedure(s) Performed: COLONOSCOPY (N/A )     Patient location during evaluation: PACU Anesthesia Type: MAC Level of consciousness: awake and alert Pain management: pain level controlled Vital Signs Assessment: post-procedure vital signs reviewed and stable Respiratory status: spontaneous breathing, nonlabored ventilation, respiratory function stable and patient connected to nasal cannula oxygen Cardiovascular status: stable and blood pressure returned to baseline Postop Assessment: no apparent nausea or vomiting Anesthetic complications: no    Last Vitals:  Vitals:   01/11/17 1014 01/11/17 1020  BP: (!) 86/53 (!) 101/56  Pulse: 92 90  Resp: 12 15  Temp: 36.4 C   SpO2: 100% 100%    Last Pain:  Vitals:   01/11/17 1014  TempSrc: Oral  PainSc: 0-No pain                 Melisse Caetano DAVID

## 2017-01-11 NOTE — Progress Notes (Addendum)
Patient ID: Johnny Patel, male   DOB: November 26, 1931, 83 y.o.   MRN: [id number]  PROGRESS NOTE    Johnny Patel  BTD:[id number] DOB: Mar 03, 1931 DOA: 01/09/2017 PCP: Patient, No Pcp Per   Brief Narrative:  83 year old male with history of recent diagnosis of atrial fibrillation and obstructive uropathy with recent cystoscopy and stone extraction who had presented to the urology office for urodynamic study but was sent to the ER for extreme weakness.  He was hypotensive with drop in hemoglobin from 11 to 8 and FOBT was positive.  He was transfused 2 units of packed red cells and GI was consulted.  He had EGD on 01/10/2017 and colonoscopy today.   Assessment & Plan:   Principal Problem:   Acute GI bleeding Active Problems:   Unspecified atrial fibrillation (HCC)   Acute blood loss anemia   Protein-calorie malnutrition, severe    1. Acute GI bleed- status post EGD on 01/10/2017.   Status post colonoscopy today which showed external hemorrhoids, abnormal rectal mucosa, one sigmoid polyp which was removed, 5 polyps in the hepatic flexure which were removed and 2 endoclips were placed along with diverticulosis in sigmoid and descending colon.  Follow pathology.  Will probably resume anticoagulation in a.m. if hemoglobin remains stable as per GI recommendations.  Advance diet as tolerated. 2. Acute blood loss anemia- secondary to GI bleed as above. Patient is status post 2 units PRBC. Hemoglobin is 10.3 this morning. Follow CBC in a.m. 3. Atrial fibrillation with RVR- patient heart rate is now controlled.  Anticoagulation plan as above  4. obstructive uropathy- recent stone extraction, on Foley catheter. Will follow with urologist as outpatient. Flomax on hold. 5. Leukocytosis-probably reactive.  Repeat a.m. Labs 6. Severe malnutrition due to acute illness-follow nutrition recommendations  DVT prophylaxis: SCDs Code Status: Full Family Communication: None at bedside Disposition Plan: Home in 1-2  days  Consultants: GI  Procedures: EGD on 01/10/2017 Impression:               - Normal esophagus.                           - Normal stomach.                           - Mucosal nodule found in the duodenum. Biopsied.  Colonoscopy on 01/11/2017 Impression:               - Non-thrombosed external hemorrhoids found on                            perianal exam.                           - Plaque covered mucosa in the rectum. Biopsied.                           - Five 5 to 15 mm polyps at the hepatic flexure,                            removed with a hot snare. Resected and retrieved.  Clips were placed.                           - One 6 mm polyp in the sigmoid colon, removed with                            a hot snare. Resected and retrieved.                           - Diverticulosis in the sigmoid colon and in the                            descending colon.                           - Non-bleeding internal hemorrhoids.                           - The examined portion of the ileum was normal.    Antimicrobials: None   Subjective: Patient seen and examined at bedside.  He denies any overnight fever, nausea or vomiting.  Objective: Vitals:   01/11/17 0855 01/11/17 1014 01/11/17 1020 01/11/17 1056  BP: 117/83 (!) 86/53 (!) 101/56 (!) 100/56  Pulse: 95 92 90   Resp: (!) 22 12 15  (!) 21  Temp: 98.1 F (36.7 C) 97.6 F (36.4 C)    TempSrc: Oral Oral    SpO2: 97% 100% 100% 93%  Weight:      Height:        Intake/Output Summary (Last 24 hours) at 01/11/2017 1210 Last data filed at 01/11/2017 1055 Gross per 24 hour  Intake 3745 ml  Output 2375 ml  Net 1370 ml   Filed Weights   01/09/17 2326 01/10/17 1328  Weight: 79.5 kg (175 lb 4.3 oz) 79.4 kg (175 lb)    Examination:  General exam: Appears calm and comfortable  Respiratory system: Bilateral decreased breath sound at bases Cardiovascular system: S1 & S2 heard, rate controlled   gastrointestinal system: Abdomen is nondistended, soft and nontender. Normal bowel sounds heard. Extremities: No cyanosis, clubbing, edema   Data Reviewed: I have personally reviewed following labs and imaging studies  CBC: Recent Labs  Lab 01/09/17 1707 01/10/17 1043 01/11/17 0346  WBC 18.9* 15.2* 13.0*  NEUTROABS 16.2*  --   --   HGB 8.1* 8.8* 10.3*  HCT 24.0* 25.2* 29.9*  MCV 92.0 92.6 93.4  PLT 532* 371 403*   Basic Metabolic Panel: Recent Labs  Lab 01/09/17 1707 01/10/17 1043 01/11/17 0346  NA 136 137 136  K 4.6 4.0 4.2  CL 105 107 108  CO2 22 22 20*  GLUCOSE 156* 86 80  BUN 40* 24* 15  CREATININE 1.13 0.75 0.82  CALCIUM 9.1 8.2* 8.5*   GFR: Estimated Creatinine Clearance: 74.9 mL/min (by C-G formula based on SCr of 0.82 mg/dL). Liver Function Tests: Recent Labs  Lab 01/09/17 1707  AST 28  ALT 23  ALKPHOS 42  BILITOT 0.8  PROT 6.1*  ALBUMIN 3.1*   Recent Labs  Lab 01/09/17 1707  LIPASE 26   No results for input(s): AMMONIA in the last 168 hours. Coagulation Profile: No results for input(s): INR, PROTIME in the last 168 hours. Cardiac Enzymes: No results for input(s): CKTOTAL,  CKMB, CKMBINDEX, TROPONINI in the last 168 hours. BNP (last 3 results) No results for input(s): PROBNP in the last 8760 hours. HbA1C: No results for input(s): HGBA1C in the last 72 hours. CBG: Recent Labs  Lab 01/10/17 0734 01/10/17 1535 01/10/17 1947 01/10/17 2337 01/11/17 0745  GLUCAP 81 74 66 73 97   Lipid Profile: No results for input(s): CHOL, HDL, LDLCALC, TRIG, CHOLHDL, LDLDIRECT in the last 72 hours. Thyroid Function Tests: No results for input(s): TSH, T4TOTAL, FREET4, T3FREE, THYROIDAB in the last 72 hours. Anemia Panel: No results for input(s): VITAMINB12, FOLATE, FERRITIN, TIBC, IRON, RETICCTPCT in the last 72 hours. Sepsis Labs: Recent Labs  Lab 01/09/17 1717 01/09/17 1816  LATICACIDVEN 2.82* 2.14*    Recent Results (from the past 240 hour(s))    MRSA PCR Screening     Status: None   Collection Time: 01/09/17 11:16 PM  Result Value Ref Range Status   MRSA by PCR NEGATIVE NEGATIVE Final    Comment:        The GeneXpert MRSA Assay (FDA approved for NASAL specimens only), is one component of a comprehensive MRSA colonization surveillance program. It is not intended to diagnose MRSA infection nor to guide or monitor treatment for MRSA infections.          Radiology Studies: Dg Chest 2 View  Result Date: 01/09/2017 CLINICAL DATA:  Tachycardia, elevated lactate, weight loss EXAM: CHEST  2 VIEW COMPARISON:  None ; correlation CT abdomen and pelvis 11/05/2016 FINDINGS: Normal heart size, mediastinal contours, and pulmonary vascularity. Mild tortuosity of thoracic aorta. Lungs clear. No pleural effusion or pneumothorax. 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 IMPRESSION: No acute pulmonary abnormalities. Electronically Signed   By: Lavonia Dana M.D.   On: 01/09/2017 18:46   Ct Angio Chest Pe W And/or Wo Contrast  Result Date: 01/09/2017 CLINICAL DATA:  Weight loss and fatigue with dizziness and difficulty standing along. Intermittent nausea. EXAM: CT ANGIOGRAPHY CHEST WITH CONTRAST TECHNIQUE: Multidetector CT imaging of the chest was performed using the standard protocol during bolus administration of intravenous contrast. Multiplanar CT image reconstructions and MIPs were obtained to evaluate the vascular anatomy. CONTRAST:  90 mL Isovue 370 IV. COMPARISON:  CT abdomen/pelvis 11/05/2016 FINDINGS: Cardiovascular: Heart is normal size. Mild calcified plaque over the thoracic aorta. No evidence of pulmonary embolism. Mediastinum/Nodes: No hilar or mediastinal adenopathy. Remaining mediastinal structures are unremarkable. Lungs/Pleura: Lungs are well inflated  without focal consolidation or effusion. There is focal fatty pleural thickening along the wall lateral right lower thorax unchanged. Airways are normal. Upper Abdomen: Couple liver cysts unchanged with the larger measuring 3.2 cm. 2.2 cm gallstone. Musculoskeletal: Degenerative change of the spine. Review of the MIP images confirms the above findings. IMPRESSION: No evidence of pulmonary embolism. No acute cardiopulmonary disease. 2.2 cm gallstone. Two liver cysts unchanged. Aortic Atherosclerosis (ICD10-I70.0). Electronically Signed   By: Marin Olp M.D.   On: 01/09/2017 21:39        Scheduled Meds: . multivitamin with minerals  1 tablet Oral Daily  . [START ON 01/13/2017] pantoprazole  40 mg Intravenous Q12H   Continuous Infusions: . pantoprozole (PROTONIX) infusion 8 mg/hr (01/11/17 1055)     LOS: 2 days        Aline August, MD [facility] Pager [phone]  If 7PM-7AM, please contact night-coverage [email or web address]  Password [email or web address] 01/11/2017, 12:10 PM

## 2017-01-11 NOTE — Brief Op Note (Signed)
01/09/2017 - 01/11/2017  10:31 AM  PATIENT:  Johnny Patel  83 y.o. male  PRE-OPERATIVE DIAGNOSIS:  GI Bleed  POST-OPERATIVE DIAGNOSIS:  Large external hemroids, 5 polyps at hepatic flecture, 2 clips placed , 1 sigmoid colon polyp, Abnormal rectal mucosa  PROCEDURE:  Procedure(s): COLONOSCOPY (N/A)  SURGEON:  Surgeon(s) and Role:    Ronnette Juniper, MD - Primary  PHYSICIAN ASSISTANT:   ASSISTANTS: Laverta Baltimore, RN, Will Bonnet, Tech  ANESTHESIA:   MAC  EBL:  None  BLOOD ADMINISTERED:none  DRAINS: none   LOCAL MEDICATIONS USED:  NONE  SPECIMEN:  Biopsy / Limited Resection  DISPOSITION OF SPECIMEN:  PATHOLOGY  COUNTS:  YES  TOURNIQUET:  * No tourniquets in log *  DICTATION: .Dragon Dictation  PLAN OF CARE: Admit to inpatient   PATIENT DISPOSITION:  PACU - hemodynamically stable.   Delay start of Pharmacological VTE agent (>24hrs) due to surgical blood loss or risk of bleeding: yes

## 2017-01-12 ENCOUNTER — Encounter (HOSPITAL_COMMUNITY): Payer: Self-pay | Admitting: Gastroenterology

## 2017-01-12 LAB — CBC WITH DIFFERENTIAL/PLATELET
BASOS ABS: 0.1 10*3/uL (ref 0.0–0.1)
Basophils Relative: 1 %
Eosinophils Absolute: 0.4 10*3/uL (ref 0.0–0.7)
Eosinophils Relative: 5 %
HEMATOCRIT: 23.9 % — AB (ref 39.0–52.0)
HEMOGLOBIN: 8.1 g/dL — AB (ref 13.0–17.0)
LYMPHS PCT: 31 %
Lymphs Abs: 2.6 10*3/uL (ref 0.7–4.0)
MCH: 32 pg (ref 26.0–34.0)
MCHC: 33.9 g/dL (ref 30.0–36.0)
MCV: 94.5 fL (ref 78.0–100.0)
MONO ABS: 0.8 10*3/uL (ref 0.1–1.0)
Monocytes Relative: 9 %
NEUTROS ABS: 4.7 10*3/uL (ref 1.7–7.7)
Neutrophils Relative %: 54 %
Platelets: 378 10*3/uL (ref 150–400)
RBC: 2.53 MIL/uL — AB (ref 4.22–5.81)
RDW: 15 % (ref 11.5–15.5)
WBC: 8.6 10*3/uL (ref 4.0–10.5)

## 2017-01-12 LAB — BASIC METABOLIC PANEL
ANION GAP: 4 — AB (ref 5–15)
BUN: 10 mg/dL (ref 6–20)
CHLORIDE: 109 mmol/L (ref 101–111)
CO2: 25 mmol/L (ref 22–32)
Calcium: 8.2 mg/dL — ABNORMAL LOW (ref 8.9–10.3)
Creatinine, Ser: 0.79 mg/dL (ref 0.61–1.24)
GFR calc Af Amer: >60 mL/min (ref >60)
GFR calc non Af Amer: >60 mL/min (ref >60)
GLUCOSE: 89 mg/dL (ref 65–99)
POTASSIUM: 3.5 mmol/L (ref 3.5–5.1)
Sodium: 138 mmol/L (ref 135–145)

## 2017-01-12 LAB — MAGNESIUM: Magnesium: 1.9 mg/dL (ref 1.7–2.4)

## 2017-01-12 MED ORDER — RIVAROXABAN 20 MG PO TABS
20.0000 mg | ORAL_TABLET | Freq: Every day | ORAL | Status: DC
  Administered 2017-01-12: 20 mg via ORAL
  Filled 2017-01-12: qty 1

## 2017-01-12 NOTE — Progress Notes (Signed)
Patient ID: Johnny Patel, male   DOB: 1931/11/07, 83 y.o.   MRN: [id number]  PROGRESS NOTE    MUSCAB BRENNEMAN  WPY:[id number] DOB: 1931-02-25 DOA: 01/09/2017 PCP: Patient, No Pcp Per   Brief Narrative:  83 year old male with history of recent diagnosis of atrial fibrillation and obstructive uropathy with recent cystoscopy and stone extraction who had presented to the urology office for urodynamic study but was sent to the ER for extreme weakness.  He was hypotensive with drop in hemoglobin from 11 to 8 and FOBT was positive.  He was transfused 2 units of packed red cells and GI was consulted.  He had EGD on 01/10/2017 and colonoscopy today.   Assessment & Plan:   Principal Problem:   Acute GI bleeding Active Problems:   Unspecified atrial fibrillation (HCC)   Acute blood loss anemia   Protein-calorie malnutrition, severe    1. Acute GI bleed- status post EGD on 01/10/2017.   Status post colonoscopy on 01/11/2017 which showed external hemorrhoids, abnormal rectal mucosa, one sigmoid polyp which was removed, 5 polyps in the hepatic flexure which were removed and 2 endoclips were placed along with diverticulosis in sigmoid and descending colon.  Follow pathology.  Resume Xarelto today as per GI recommendations. Continue diet. No overnight bowel movements 2. Acute blood loss anemia- secondary to GI bleed as above. Patient is status post 2 units PRBC. Hemoglobin is down to 8.1 today. Follow CBC in a.m. 3. Atrial fibrillation with RVR- patient heart rate is now controlled.  Anticoagulation plan as above  4. obstructive uropathy- recent stone extraction, on Foley catheter. Will follow with urologist as outpatient. Flomax on hold. 5. Leukocytosis-probably reactive.  Resolved 6. Severe malnutrition due to acute illness-follow nutrition recommendations  DVT prophylaxis: SCDs. Restart anticoagulation today Code Status: Full Family Communication: None at bedside Disposition Plan: Home in 1-2  days  Consultants: GI  Procedures: EGD on 01/10/2017 Impression:               - Normal esophagus.                           - Normal stomach.                           - Mucosal nodule found in the duodenum. Biopsied.  Colonoscopy on 01/11/2017 Impression:               - Non-thrombosed external hemorrhoids found on                            perianal exam.                           - Plaque covered mucosa in the rectum. Biopsied.                           - Five 5 to 15 mm polyps at the hepatic flexure,                            removed with a hot snare. Resected and retrieved.                            Clips were placed.                           -  One 6 mm polyp in the sigmoid colon, removed with                            a hot snare. Resected and retrieved.                           - Diverticulosis in the sigmoid colon and in the                            descending colon.                           - Non-bleeding internal hemorrhoids.                           - The examined portion of the ileum was normal.    Antimicrobials: None   Subjective: Patient seen and examined at bedside.  He denies any overnight fever, nausea or vomiting. He did not have any overnight bowel movements.  Objective: Vitals:   01/11/17 1200 01/11/17 1604 01/11/17 2132 01/12/17 0441  BP: 106/60 119/73 92/61 107/66  Pulse:  (!) 101 88 80  Resp: 17 16 16 16   Temp: 98 F (36.7 C) 97.8 F (36.6 C) 98.4 F (36.9 C) 97.6 F (36.4 C)  TempSrc: Oral Oral Oral Oral  SpO2: 100% 99% 98% 98%  Weight:      Height:        Intake/Output Summary (Last 24 hours) at 01/12/2017 1043 Last data filed at 01/12/2017 0600 Gross per 24 hour  Intake 310 ml  Output 1800 ml  Net -1490 ml   Filed Weights   01/09/17 2326 01/10/17 1328  Weight: 79.5 kg (175 lb 4.3 oz) 79.4 kg (175 lb)    Examination:  General exam: Appears calm and comfortable. No distress  Respiratory system: Bilateral decreased  breath sound at bases Cardiovascular system: S1 & S2 heard, rate controlled  gastrointestinal system: Abdomen is nondistended, soft and nontender. Normal bowel sounds heard. Extremities: No cyanosis, clubbing, edema   Data Reviewed: I have personally reviewed following labs and imaging studies  CBC: Recent Labs  Lab 01/09/17 1707 01/10/17 1043 01/11/17 0346 01/12/17 0526  WBC 18.9* 15.2* 13.0* 8.6  NEUTROABS 16.2*  --   --  4.7  HGB 8.1* 8.8* 10.3* 8.1*  HCT 24.0* 25.2* 29.9* 23.9*  MCV 92.0 92.6 93.4 94.5  PLT 532* 371 418* 270   Basic Metabolic Panel: Recent Labs  Lab 01/09/17 1707 01/10/17 1043 01/11/17 0346 01/12/17 0526  NA 136 137 136 138  K 4.6 4.0 4.2 3.5  CL 105 107 108 109  CO2 22 22 20* 25  GLUCOSE 156* 86 80 89  BUN 40* 24* 15 10  CREATININE 1.13 0.75 0.82 0.79  CALCIUM 9.1 8.2* 8.5* 8.2*  MG  --   --   --  1.9   GFR: Estimated Creatinine Clearance: 76.8 mL/min (by C-G formula based on SCr of 0.79 mg/dL). Liver Function Tests: Recent Labs  Lab 01/09/17 1707  AST 28  ALT 23  ALKPHOS 42  BILITOT 0.8  PROT 6.1*  ALBUMIN 3.1*   Recent Labs  Lab 01/09/17 1707  LIPASE 26   No results for input(s): AMMONIA in the last 168 hours. Coagulation Profile: No results for input(s): INR, PROTIME  in the last 168 hours. Cardiac Enzymes: No results for input(s): CKTOTAL, CKMB, CKMBINDEX, TROPONINI in the last 168 hours. BNP (last 3 results) No results for input(s): PROBNP in the last 8760 hours. HbA1C: No results for input(s): HGBA1C in the last 72 hours. CBG: Recent Labs  Lab 01/10/17 1535 01/10/17 1947 01/10/17 2337 01/11/17 0745 01/11/17 1209  GLUCAP 74 66 73 97 81   Lipid Profile: No results for input(s): CHOL, HDL, LDLCALC, TRIG, CHOLHDL, LDLDIRECT in the last 72 hours. Thyroid Function Tests: No results for input(s): TSH, T4TOTAL, FREET4, T3FREE, THYROIDAB in the last 72 hours. Anemia Panel: No results for input(s): VITAMINB12, FOLATE,  FERRITIN, TIBC, IRON, RETICCTPCT in the last 72 hours. Sepsis Labs: Recent Labs  Lab 01/09/17 1717 01/09/17 1816  LATICACIDVEN 2.82* 2.14*    Recent Results (from the past 240 hour(s))  MRSA PCR Screening     Status: None   Collection Time: 01/09/17 11:16 PM  Result Value Ref Range Status   MRSA by PCR NEGATIVE NEGATIVE Final    Comment:        The GeneXpert MRSA Assay (FDA approved for NASAL specimens only), is one component of a comprehensive MRSA colonization surveillance program. It is not intended to diagnose MRSA infection nor to guide or monitor treatment for MRSA infections.          Radiology Studies: No results found.      Scheduled Meds: . multivitamin with minerals  1 tablet Oral Daily  . pantoprazole  40 mg Intravenous Daily   Continuous Infusions:    LOS: 3 days        Aline August, MD [facility] Pager [phone]  If 7PM-7AM, please contact night-coverage [email or web address] Password [facility] 01/12/2017, 10:43 AM

## 2017-01-12 NOTE — Evaluation (Signed)
Physical Therapy Evaluation Patient Details Name: Johnny Patel MRN: [id number] DOB: Jun 18, 1931 Today's Date: 01/12/2017   History of Present Illness  83 year old male with history of recent diagnosis of atrial fibrillation and obstructive uropathy with recent cystoscopy and stone extraction who had presented to the urology office for urodynamic study but was sent to the ER for extreme weakness.  He was hypotensive with drop in hemoglobin from 11 to 8 and FOBT was positive.  He was transfused 2 units of packed red cells and GI was consulted.  Clinical Impression  Pt is independent with mobility, no further PT indicated. Pt ambulated 350' without an assistive device, no loss of balance. Encouraged pt to ambulate in halls TID to prevent deconditioning. PT signing off.      Follow Up Recommendations No PT follow up    Equipment Recommendations  None recommended by PT    Recommendations for Other Services       Precautions / Restrictions Precautions Precautions: None Precaution Comments: denies h/o falls in past 1 year Restrictions Weight Bearing Restrictions: No      Mobility  Bed Mobility Overal bed mobility: Independent                Transfers Overall transfer level: Independent                  Ambulation/Gait Ambulation/Gait assistance: Independent Ambulation Distance (Feet): 350 Feet Assistive device: None Gait Pattern/deviations: WFL(Within Functional Limits)   Gait velocity interpretation: at or above normal speed for age/gender General Gait Details: steady without AD, no loss of balance  Stairs            Wheelchair Mobility    Modified Rankin (Stroke Patients Only)       Balance Overall balance assessment: Independent                                           Pertinent Vitals/Pain Pain Assessment: No/denies pain    Home Living Family/patient expects to be discharged to:: Private residence Living Arrangements:  Spouse/significant other Available Help at Discharge: Family;Available 24 hours/day Type of Home: House Home Access: Stairs to enter   [employer] of Steps: 4 Home Layout: Two level Home Equipment: None      Prior Function Level of Independence: Independent               Hand Dominance        Extremity/Trunk Assessment   Upper Extremity Assessment Upper Extremity Assessment: Overall WFL for tasks assessed    Lower Extremity Assessment Lower Extremity Assessment: Overall WFL for tasks assessed    Cervical / Trunk Assessment Cervical / Trunk Assessment: Normal  Communication      Cognition Arousal/Alertness: Awake/alert Behavior During Therapy: WFL for tasks assessed/performed Overall Cognitive Status: Within Functional Limits for tasks assessed                                        General Comments      Exercises     Assessment/Plan    PT Assessment Patent does not need any further PT services  PT Problem List         PT Treatment Interventions      PT Goals (Current goals can be found in the Care Plan section)  Acute Rehab PT Goals Patient Stated Goal: yardwork PT Goal Formulation: All assessment and education complete, DC therapy    Frequency     Barriers to discharge        Co-evaluation               AM-PAC PT "6 Clicks" Daily Activity  Outcome Measure Difficulty turning over in bed (including adjusting bedclothes, sheets and blankets)?: None Difficulty moving from lying on back to sitting on the side of the bed? : None Difficulty sitting down on and standing up from a chair with arms (e.g., wheelchair, bedside commode, etc,.)?: None Help needed moving to and from a bed to chair (including a wheelchair)?: None Help needed walking in hospital room?: None Help needed climbing 3-5 steps with a railing? : None 6 Click Score: 24    End of Session Equipment Utilized During Treatment: Gait belt Activity  Tolerance: Patient tolerated treatment well Patient left: in chair;with call bell/phone within reach Nurse Communication: Mobility status;Other (comment)(pt safe to walk in halls independently)      Time: 2060-1561 PT Time Calculation (min) (ACUTE ONLY): 22 min   Charges:   PT Evaluation $PT Eval Low Complexity: 1 Low     PT G Codes:          Philomena Doheny 01/12/2017, 10:09 AM [phone]

## 2017-01-12 NOTE — Progress Notes (Signed)
Subjective: The patient was seen and examined at bedside. He denies having any bowel movement since colonoscopy. He was able to tolerate regular diet, denies any abdominal pain, nausea or vomiting.  Objective: Vital signs in last 24 hours: Temp:  [97.6 F (36.4 C)-98.4 F (36.9 C)] 97.6 F (36.4 C) (12/23 0441) Pulse Rate:  [80-101] 80 (12/23 0441) Resp:  [12-21] 16 (12/23 0441) BP: (86-119)/(53-73) 107/66 (12/23 0441) SpO2:  [93 %-100 %] 98 % (12/23 0441) Weight change:  Last BM Date: 01/11/17  PE: Out of bed to chair, not in distress GENERAL: Mild pallor, no icterus ABDOMEN: Soft, non-distended, non-tender, intermittent high-pitched bowel sounds audible EXTREMITIES: No deformity  Lab Results: Results for orders placed or performed during the hospital encounter of 01/09/17 (from the past 48 hour(s))  Basic metabolic panel     Status: Abnormal   Collection Time: 01/10/17 10:43 AM  Result Value Ref Range   Sodium 137 135 - 145 mmol/L   Potassium 4.0 3.5 - 5.1 mmol/L   Chloride 107 101 - 111 mmol/L   CO2 22 22 - 32 mmol/L   Glucose, Bld 86 65 - 99 mg/dL   BUN 24 (H) 6 - 20 mg/dL   Creatinine, Ser 0.75 0.61 - 1.24 mg/dL   Calcium 8.2 (L) 8.9 - 10.3 mg/dL   GFR calc non Af Amer >60 >60 mL/min   GFR calc Af Amer >60 >60 mL/min    Comment: (NOTE) The eGFR has been calculated using the CKD EPI equation. This calculation has not been validated in all clinical situations. eGFR's persistently <60 mL/min signify possible Chronic Kidney Disease.    Anion gap 8 5 - 15  CBC     Status: Abnormal   Collection Time: 01/10/17 10:43 AM  Result Value Ref Range   WBC 15.2 (H) 4.0 - 10.5 K/uL   RBC 2.72 (L) 4.22 - 5.81 MIL/uL   Hemoglobin 8.8 (L) 13.0 - 17.0 g/dL   HCT 25.2 (L) 39.0 - 52.0 %   MCV 92.6 78.0 - 100.0 fL   MCH 32.4 26.0 - 34.0 pg   MCHC 34.9 30.0 - 36.0 g/dL   RDW 14.4 11.5 - 15.5 %   Platelets 371 150 - 400 K/uL  Glucose, capillary     Status: None   Collection  Time: 01/10/17  3:35 PM  Result Value Ref Range   Glucose-Capillary 74 65 - 99 mg/dL   Comment 1 Notify RN    Comment 2 Document in Chart   Glucose, capillary     Status: None   Collection Time: 01/10/17  7:47 PM  Result Value Ref Range   Glucose-Capillary 66 65 - 99 mg/dL   Comment 1 Notify RN    Comment 2 Document in Chart   Glucose, capillary     Status: None   Collection Time: 01/10/17 11:37 PM  Result Value Ref Range   Glucose-Capillary 73 65 - 99 mg/dL   Comment 1 Notify RN    Comment 2 Document in Chart   CBC     Status: Abnormal   Collection Time: 01/11/17  3:46 AM  Result Value Ref Range   WBC 13.0 (H) 4.0 - 10.5 K/uL   RBC 3.20 (L) 4.22 - 5.81 MIL/uL   Hemoglobin 10.3 (L) 13.0 - 17.0 g/dL   HCT 29.9 (L) 39.0 - 52.0 %   MCV 93.4 78.0 - 100.0 fL   MCH 32.2 26.0 - 34.0 pg   MCHC 34.4 30.0 - 36.0 g/dL  RDW 14.6 11.5 - 15.5 %   Platelets 418 (H) 150 - 400 K/uL  Basic metabolic panel     Status: Abnormal   Collection Time: 01/11/17  3:46 AM  Result Value Ref Range   Sodium 136 135 - 145 mmol/L   Potassium 4.2 3.5 - 5.1 mmol/L   Chloride 108 101 - 111 mmol/L   CO2 20 (L) 22 - 32 mmol/L   Glucose, Bld 80 65 - 99 mg/dL   BUN 15 6 - 20 mg/dL   Creatinine, Ser 0.82 0.61 - 1.24 mg/dL   Calcium 8.5 (L) 8.9 - 10.3 mg/dL   GFR calc non Af Amer >60 >60 mL/min   GFR calc Af Amer >60 >60 mL/min    Comment: (NOTE) The eGFR has been calculated using the CKD EPI equation. This calculation has not been validated in all clinical situations. eGFR's persistently <60 mL/min signify possible Chronic Kidney Disease.    Anion gap 8 5 - 15  Glucose, capillary     Status: None   Collection Time: 01/11/17  7:45 AM  Result Value Ref Range   Glucose-Capillary 97 65 - 99 mg/dL   Comment 1 Notify RN    Comment 2 Document in Chart   Glucose, capillary     Status: None   Collection Time: 01/11/17 12:09 PM  Result Value Ref Range   Glucose-Capillary 81 65 - 99 mg/dL   Comment 1 Notify  RN    Comment 2 Document in Chart   CBC with Differential/Platelet     Status: Abnormal   Collection Time: 01/12/17  5:26 AM  Result Value Ref Range   WBC 8.6 4.0 - 10.5 K/uL   RBC 2.53 (L) 4.22 - 5.81 MIL/uL   Hemoglobin 8.1 (L) 13.0 - 17.0 g/dL    Comment: DELTA CHECK NOTED REPEATED TO VERIFY    HCT 23.9 (L) 39.0 - 52.0 %   MCV 94.5 78.0 - 100.0 fL   MCH 32.0 26.0 - 34.0 pg   MCHC 33.9 30.0 - 36.0 g/dL   RDW 15.0 11.5 - 15.5 %   Platelets 378 150 - 400 K/uL   Neutrophils Relative % 54 %   Neutro Abs 4.7 1.7 - 7.7 K/uL   Lymphocytes Relative 31 %   Lymphs Abs 2.6 0.7 - 4.0 K/uL   Monocytes Relative 9 %   Monocytes Absolute 0.8 0.1 - 1.0 K/uL   Eosinophils Relative 5 %   Eosinophils Absolute 0.4 0.0 - 0.7 K/uL   Basophils Relative 1 %   Basophils Absolute 0.1 0.0 - 0.1 K/uL  Basic metabolic panel     Status: Abnormal   Collection Time: 01/12/17  5:26 AM  Result Value Ref Range   Sodium 138 135 - 145 mmol/L   Potassium 3.5 3.5 - 5.1 mmol/L   Chloride 109 101 - 111 mmol/L   CO2 25 22 - 32 mmol/L   Glucose, Bld 89 65 - 99 mg/dL   BUN 10 6 - 20 mg/dL   Creatinine, Ser 0.79 0.61 - 1.24 mg/dL   Calcium 8.2 (L) 8.9 - 10.3 mg/dL   GFR calc non Af Amer >60 >60 mL/min   GFR calc Af Amer >60 >60 mL/min    Comment: (NOTE) The eGFR has been calculated using the CKD EPI equation. This calculation has not been validated in all clinical situations. eGFR's persistently <60 mL/min signify possible Chronic Kidney Disease.    Anion gap 4 (L) 5 - 15  Magnesium  Status: None   Collection Time: 01/12/17  5:26 AM  Result Value Ref Range   Magnesium 1.9 1.7 - 2.4 mg/dL    Studies/Results: No results found.  Medications: I have reviewed the patient's current medications.  Assessment: Anemia Hemoglobin yesterday 10.3-8.1 today, no obvious hematochezia or melena EGD from 01/10/17 showed normal esophagus, normal stomach and mucosal nodule in duodenum which was biopsied Colonoscopy  from 01/11/17 showed large external hemorrhoids, 5 polyps at hepatic flexure, removed and 2 clips placed, one sigmoid polyp, abnormal rectal mucosa biopsied Diverticulosis in sigmoid and descending    Plan: Continue regular diet, monitor hemoglobin in a.m., if it is the same level as today, okay to DC home in a.m.. I would recommend resuming Xarelto today, f/u hemoglobin tomorrow. No evidence of active bleeding noted.   Ronnette Juniper 01/12/2017, 10:13 AM   Pager [phone] If no answer or after 5 PM call [phone]

## 2017-01-12 NOTE — Progress Notes (Signed)
ANTICOAGULATION CONSULT NOTE - Initial Consult  Pharmacy Consult for Xarelto Indication: atrial fibrillation  Allergies  Allergen Reactions  . Flomax [Tamsulosin Hcl] Nausea Only    Patient Measurements: Height: 6' (182.9 cm) Weight: 175 lb (79.4 kg) IBW/kg (Calculated) : 77.6  Vital Signs: Temp: 97.6 F (36.4 C) (12/23 0441) Temp Source: Oral (12/23 0441) BP: 107/66 (12/23 0441) Pulse Rate: 80 (12/23 0441)  Labs: Recent Labs    01/10/17 1043 01/11/17 0346 01/12/17 0526  HGB 8.8* 10.3* 8.1*  HCT 25.2* 29.9* 23.9*  PLT 371 418* 378  CREATININE 0.75 0.82 0.79    Estimated Creatinine Clearance: 76.8 mL/min (by C-G formula based on SCr of 0.79 mg/dL).   Medical History: Past Medical History:  Diagnosis Date  . Dysrhythmia    atrial fibrillation  . History of kidney stones     Medications:  PTA Xarelto 20mg  po daily  Assessment: 83 yo M started on Xarelto for Afib.  He presented with weakness, drop in Hg.  Xarelto was held pending work-up for GI bleed.  He is now s/p EGD, colonoscopy and GI recommends resuming Xarelto today.    Plan:  Johnny Patel 20mg  po daily with food F/U CBC, s/sx of bleeding  Shuntell Foody, Lavonia Drafts 01/12/2017,10:53 AM

## 2017-01-13 DIAGNOSIS — R339 Retention of urine, unspecified: Secondary | ICD-10-CM

## 2017-01-13 LAB — MAGNESIUM: MAGNESIUM: 1.8 mg/dL (ref 1.7–2.4)

## 2017-01-13 LAB — CBC WITH DIFFERENTIAL/PLATELET
BASOS ABS: 0.1 10*3/uL (ref 0.0–0.1)
BASOS PCT: 1 %
EOS ABS: 0.3 10*3/uL (ref 0.0–0.7)
EOS PCT: 4 %
HCT: 24.7 % — ABNORMAL LOW (ref 39.0–52.0)
Hemoglobin: 8.3 g/dL — ABNORMAL LOW (ref 13.0–17.0)
LYMPHS PCT: 30 %
Lymphs Abs: 2.5 10*3/uL (ref 0.7–4.0)
MCH: 32.2 pg (ref 26.0–34.0)
MCHC: 33.6 g/dL (ref 30.0–36.0)
MCV: 95.7 fL (ref 78.0–100.0)
MONO ABS: 0.8 10*3/uL (ref 0.1–1.0)
Monocytes Relative: 9 %
Neutro Abs: 4.7 10*3/uL (ref 1.7–7.7)
Neutrophils Relative %: 56 %
PLATELETS: 392 10*3/uL (ref 150–400)
RBC: 2.58 MIL/uL — ABNORMAL LOW (ref 4.22–5.81)
RDW: 15.7 % — AB (ref 11.5–15.5)
WBC: 8.4 10*3/uL (ref 4.0–10.5)

## 2017-01-13 LAB — BASIC METABOLIC PANEL
ANION GAP: 4 — AB (ref 5–15)
BUN: 11 mg/dL (ref 6–20)
CALCIUM: 8.1 mg/dL — AB (ref 8.9–10.3)
CO2: 25 mmol/L (ref 22–32)
Chloride: 107 mmol/L (ref 101–111)
Creatinine, Ser: 0.69 mg/dL (ref 0.61–1.24)
GFR calc Af Amer: >60 mL/min (ref >60)
GLUCOSE: 94 mg/dL (ref 65–99)
Potassium: 3.4 mmol/L — ABNORMAL LOW (ref 3.5–5.1)
SODIUM: 136 mmol/L (ref 135–145)

## 2017-01-13 MED ORDER — PANTOPRAZOLE SODIUM 40 MG PO TBEC
40.0000 mg | DELAYED_RELEASE_TABLET | Freq: Every day | ORAL | Status: DC
  Administered 2017-01-13: 40 mg via ORAL
  Filled 2017-01-13: qty 1

## 2017-01-13 MED ORDER — POTASSIUM CHLORIDE CRYS ER 20 MEQ PO TBCR
60.0000 meq | EXTENDED_RELEASE_TABLET | Freq: Once | ORAL | Status: AC
Start: 2017-01-13 — End: 2017-01-13
  Administered 2017-01-13: 60 meq via ORAL
  Filled 2017-01-13: qty 3

## 2017-01-13 MED ORDER — RIVAROXABAN 20 MG PO TABS: 20.0000 mg | ORAL_TABLET | Freq: Every day | ORAL | 0 refills | Status: AC

## 2017-01-13 MED ORDER — PANTOPRAZOLE SODIUM 40 MG PO TBEC: 40.0000 mg | DELAYED_RELEASE_TABLET | Freq: Every day | ORAL | 0 refills | Status: DC

## 2017-01-13 NOTE — Discharge Summary (Signed)
Physician Discharge Summary  Johnny Patel HKV:[id number] DOB: Dec 10, 1931 DOA: 01/09/2017  PCP: Patient, No Pcp Per  Admit date: 01/09/2017 Discharge date: 01/13/2017  Admitted From: Home Disposition:  Home  Recommendations for Outpatient Follow-up:  1. Follow up with PCP in 1 week with repeat CBC/BMP 2. Follow-up with GI/Dr. Therisa Doyne in 2-3 weeks 3. Follow-up with urology/Dr. Alyson Ingles at earliest [place]: No  Equipment/Devices: None  Discharge Condition: Stable CODE STATUS: Full  Diet recommendation: Heart Healthy   Brief/Interim Summary: 83 year old male with history of recent diagnosis of atrial fibrillation and obstructive uropathy with recent cystoscopy and stone extraction who had presented to the urology office for urodynamic study but was sent to the ER for extreme weakness.  He was hypotensive with drop in hemoglobin from 11 to 8 and FOBT was positive.  He was transfused 2 units of packed red cells and GI was consulted.  He had EGD on 01/10/2017 and colonoscopy on 01/11/2017. He was restarted on Xarelto on 01/12/2017. His hemoglobin has remained stable. GI has cleared the patient for discharge.    Discharge Diagnoses:  Principal Problem:   Acute GI bleeding Active Problems:   Unspecified atrial fibrillation (HCC)   Acute blood loss anemia   Protein-calorie malnutrition, severe   1. Acute GI bleed-status post EGD on 01/10/2017.  Status post colonoscopy on 01/11/2017 which showed external hemorrhoids, abnormal rectal mucosa, one sigmoid polyp which was removed, 5 polyps in the hepatic flexure which were removed and 2 endoclips were placed along with diverticulosis in sigmoid and descending colon.  Follow pathology.  Xarelto  already resumed as per GI recommendations. Tolerating diet. Hemoglobin has remained stable. GI has cleared the patient for discharge. Outpatient follow-up with primary care provider and GI. 2. Acute blood loss anemia-secondary to GI  bleed as above. Patient is status post 2 units PRBC. Hemoglobin is 8.3. Outpatient follow-up of hemoglobin 3.  Atrial fibrillation with RVR-patient heart rate is now controlled.  Anticoagulation plan as above. Outpatient follow-up with PCP and/or cardiology 4. obstructive uropathy-recent stone extraction, on Foley catheter. Will follow with urologist as outpatient. Resume Flomax  5. Leukocytosis-probably reactive.  Resolved 6. Severe malnutrition due to acute illness-follow nutrition recommendations    Discharge Instructions  Discharge Instructions    Ambulatory referral to Urology   Complete by:  As directed    Follow up at earliest convenience   Call MD for:  difficulty breathing, headache or visual disturbances   Complete by:  As directed    Call MD for:  extreme fatigue   Complete by:  As directed    Call MD for:  hives   Complete by:  As directed    Call MD for:  persistant dizziness or light-headedness   Complete by:  As directed    Call MD for:  persistant nausea and vomiting   Complete by:  As directed    Call MD for:  severe uncontrolled pain   Complete by:  As directed    Call MD for:  temperature >100.4   Complete by:  As directed    Diet - low sodium heart healthy   Complete by:  As directed    Increase activity slowly   Complete by:  As directed      Allergies as of 01/13/2017      Reactions   Flomax [tamsulosin Hcl] Nausea Only      Medication List    STOP taking these medications   sulfamethoxazole-trimethoprim 400-80 MG tablet Commonly  known as:  BACTRIM,SEPTRA     TAKE these medications   pantoprazole 40 MG tablet Commonly known as:  PROTONIX Take 1 tablet (40 mg total) by mouth daily.   rivaroxaban 20 MG Tabs tablet Commonly known as:  XARELTO Take 1 tablet (20 mg total) by mouth daily with supper. Start taking on:  01/22/2017 What changed:  These instructions start on 01/22/2017. If you are unsure what to do until then, ask your doctor or other  care provider.   tamsulosin 0.4 MG Caps capsule Commonly known as:  FLOMAX Take 1 capsule (0.4 mg total) by mouth daily after supper.       Follow-up Information    PCP. Schedule an appointment as soon as possible for a visit in 1 week(s).   Why:  with repeat CBC/BMP       McKenzie, Candee Furbish, MD. Schedule an appointment as soon as possible for a visit in 1 week(s).   Specialty:  Urology Why:  at earliest [facility] information: [place] [address] [phone]        Ronnette Juniper, MD. Schedule an appointment as soon as possible for a visit in 2 week(s).   Specialty:  Gastroenterology Contact information: [address] [place] [address] [phone]          Allergies  Allergen Reactions  . Flomax [Tamsulosin Hcl] Nausea Only    Consultations:  GI   Procedures/Studies: Dg Chest 2 View  Result Date: 01/09/2017 CLINICAL DATA:  Tachycardia, elevated lactate, weight loss EXAM: CHEST  2 VIEW COMPARISON:  None ; correlation CT abdomen and pelvis 11/05/2016 FINDINGS: Normal heart size, mediastinal contours, and pulmonary vascularity. Mild tortuosity of thoracic aorta. Lungs clear. No pleural effusion or pneumothorax. 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 IMPRESSION: No acute pulmonary abnormalities. Electronically Signed   By: Lavonia Dana M.D.   On: 01/09/2017 18:46   Ct Angio Chest Pe W And/or Wo Contrast  Result Date: 01/09/2017 CLINICAL DATA:  Weight loss and fatigue with dizziness and difficulty standing along. Intermittent nausea. EXAM: CT ANGIOGRAPHY CHEST WITH CONTRAST TECHNIQUE: Multidetector CT imaging of the chest was performed using the standard protocol during bolus administration of intravenous contrast. Multiplanar CT image  reconstructions and MIPs were obtained to evaluate the vascular anatomy. CONTRAST:  90 mL Isovue 370 IV. COMPARISON:  CT abdomen/pelvis 11/05/2016 FINDINGS: Cardiovascular: Heart is normal size. Mild calcified plaque over the thoracic aorta. No evidence of pulmonary embolism. Mediastinum/Nodes: No hilar or mediastinal adenopathy. Remaining mediastinal structures are unremarkable. Lungs/Pleura: Lungs are well inflated without focal consolidation or effusion. There is focal fatty pleural thickening along the wall lateral right lower thorax unchanged. Airways are normal. Upper Abdomen: Couple liver cysts unchanged with the larger measuring 3.2 cm. 2.2 cm gallstone. Musculoskeletal: Degenerative change of the spine. Review of the MIP images confirms the above findings. IMPRESSION: No evidence of pulmonary embolism. No acute cardiopulmonary disease. 2.2 cm gallstone. Two liver cysts unchanged. Aortic Atherosclerosis (ICD10-I70.0). Electronically Signed   By: Marin Olp M.D.   On: 01/09/2017 21:39    EGD on 01/10/2017 Impression: - Normal esophagus. - Normal stomach. - Mucosal nodule found in the duodenum. Biopsied.  Colonoscopy on 01/11/2017 Impression: - Non-thrombosed external hemorrhoids found on  perianal exam. - Plaque covered mucosa in the rectum.  Biopsied. - Five 5 to 15 mm polyps at the hepatic flexure,  removed with a hot snare. Resected and retrieved.  Clips were placed. - One 6 mm polyp in the sigmoid colon, removed with  a hot snare. Resected and retrieved. - Diverticulosis in the sigmoid colon and in the  descending colon. - Non-bleeding internal  hemorrhoids. - The examined portion of the ileum was normal.     Subjective: Patient seen and examined at bedside. He feels much better. He wants to go home. No overnight fever, nausea, vomiting or any bowel movements.  Discharge Exam: Vitals:   01/12/17 2040 01/13/17 0530  BP: 98/71 111/74  Pulse: 90 85  Resp: 16 17  Temp: 98.1 F (36.7 C) 98.2 F (36.8 C)  SpO2: 100% 98%   Vitals:   01/12/17 0441 01/12/17 1309 01/12/17 2040 01/13/17 0530  BP: 107/66 100/77 98/71 111/74  Pulse: 80 98 90 85  Resp: 16  16 17   Temp: 97.6 F (36.4 C)  98.1 F (36.7 C) 98.2 F (36.8 C)  TempSrc: Oral  Oral Oral  SpO2: 98% 100% 100% 98%  Weight:      Height:        General: Pt is alert, awake, not in acute distress Cardiovascular: Rate controlled, S1/S2 + Respiratory: Bilateral decreased breath sounds at bases Abdominal: Soft, NT, ND, bowel sounds + Extremities: no edema, no cyanosis    The results of significant diagnostics from this hospitalization (including imaging, microbiology, ancillary and laboratory) are listed below for reference.     Microbiology: Recent Results (from the past 240 hour(s))  MRSA PCR Screening     Status: None   Collection Time: 01/09/17 11:16 PM  Result Value Ref Range Status   MRSA by PCR NEGATIVE NEGATIVE Final    Comment:        The GeneXpert MRSA Assay (FDA approved for NASAL specimens only), is one component of a comprehensive MRSA colonization surveillance program. It is not intended to diagnose MRSA infection nor to guide or monitor treatment for MRSA infections.      Labs: BNP (last 3 results) No results for input(s): BNP in the last 8760 hours. Basic Metabolic Panel: Recent Labs  Lab 01/09/17 1707 01/10/17 1043 01/11/17 0346 01/12/17 0526 01/13/17 0500  NA 136 137 136 138 136  K 4.6 4.0 4.2 3.5 3.4*  CL 105 107 108 109 107  CO2 22 22 20* 25 25  GLUCOSE 156* 86 80 89 94  BUN 40* 24* 15 10 11    CREATININE 1.13 0.75 0.82 0.79 0.69  CALCIUM 9.1 8.2* 8.5* 8.2* 8.1*  MG  --   --   --  1.9 1.8   Liver Function Tests: Recent Labs  Lab 01/09/17 1707  AST 28  ALT 23  ALKPHOS 42  BILITOT 0.8  PROT 6.1*  ALBUMIN 3.1*   Recent Labs  Lab 01/09/17 1707  LIPASE 26   No results for input(s): AMMONIA in the last 168 hours. CBC: Recent Labs  Lab 01/09/17 1707 01/10/17 1043 01/11/17 0346 01/12/17 0526 01/13/17 0500  WBC 18.9* 15.2* 13.0* 8.6 8.4  NEUTROABS 16.2*  --   --  4.7 4.7  HGB 8.1* 8.8* 10.3* 8.1* 8.3*  HCT 24.0* 25.2* 29.9* 23.9* 24.7*  MCV 92.0 92.6 93.4 94.5 95.7  PLT 532* 371 418* 378 392   Cardiac Enzymes: No results for input(s): CKTOTAL, CKMB, CKMBINDEX, TROPONINI in the last 168 hours. BNP: Invalid input(s): POCBNP CBG: Recent Labs  Lab 01/10/17  1535 01/10/17 1947 01/10/17 2337 01/11/17 0745 01/11/17 1209  GLUCAP 74 66 73 97 81   D-Dimer No results for input(s): DDIMER in the last 72 hours. Hgb A1c No results for input(s): HGBA1C in the last 72 hours. Lipid Profile No results for input(s): CHOL, HDL, LDLCALC, TRIG, CHOLHDL, LDLDIRECT in the last 72 hours. Thyroid function studies No results for input(s): TSH, T4TOTAL, T3FREE, THYROIDAB in the last 72 hours.  Invalid input(s): FREET3 Anemia work up No results for input(s): VITAMINB12, FOLATE, FERRITIN, TIBC, IRON, RETICCTPCT in the last 72 hours. Urinalysis    Component Value Date/Time   COLORURINE YELLOW 01/09/2017 1803   APPEARANCEUR CLOUDY (A) 01/09/2017 1803   LABSPEC 1.025 01/09/2017 1803   PHURINE 5.0 01/09/2017 1803   GLUCOSEU NEGATIVE 01/09/2017 1803   HGBUR LARGE (A) 01/09/2017 1803   BILIRUBINUR NEGATIVE 01/09/2017 1803   KETONESUR NEGATIVE 01/09/2017 1803   PROTEINUR NEGATIVE 01/09/2017 1803   NITRITE NEGATIVE 01/09/2017 1803   LEUKOCYTESUR LARGE (A) 01/09/2017 1803   Sepsis Labs Invalid input(s): PROCALCITONIN,  WBC,  LACTICIDVEN Microbiology Recent Results (from the  past 240 hour(s))  MRSA PCR Screening     Status: None   Collection Time: 01/09/17 11:16 PM  Result Value Ref Range Status   MRSA by PCR NEGATIVE NEGATIVE Final    Comment:        The GeneXpert MRSA Assay (FDA approved for NASAL specimens only), is one component of a comprehensive MRSA colonization surveillance program. It is not intended to diagnose MRSA infection nor to guide or monitor treatment for MRSA infections.      Time coordinating discharge: 35 minutes  SIGNED:   Aline August, MD  [facility] 01/13/2017, 10:58 AM Pager: [phone]  If 7PM-7AM, please contact night-coverage [email or web address] Password [email or web address]

## 2017-01-20 DIAGNOSIS — N139 Obstructive and reflux uropathy, unspecified: Secondary | ICD-10-CM | POA: Diagnosis not present

## 2017-01-20 DIAGNOSIS — Z6827 Body mass index (BMI) 27.0-27.9, adult: Secondary | ICD-10-CM | POA: Diagnosis not present

## 2017-01-20 DIAGNOSIS — K922 Gastrointestinal hemorrhage, unspecified: Secondary | ICD-10-CM | POA: Diagnosis not present

## 2017-01-20 DIAGNOSIS — I482 Chronic atrial fibrillation: Secondary | ICD-10-CM | POA: Diagnosis not present

## 2017-01-20 DIAGNOSIS — Z23 Encounter for immunization: Secondary | ICD-10-CM | POA: Diagnosis not present

## 2017-02-10 ENCOUNTER — Other Ambulatory Visit: Payer: Self-pay | Admitting: Urology

## 2017-03-03 ENCOUNTER — Encounter: Payer: Self-pay | Admitting: Cardiology

## 2017-03-03 NOTE — Progress Notes (Signed)
Cardiology Office Note  Date: 03/04/2017   ID: Johnny Patel, Johnny Patel [birth year], MRN [id number]  PCP: Asencion Noble, MD  Consulting Cardiologist: Rozann Lesches, MD   Chief Complaint  Patient presents with  . Atrial Fibrillation    History of Present Illness: IMRI LOR is an 84 y.o. male referred for cardiology consultation by Dr. Willey Blade to establish follow-up of atrial fibrillation.  He presents today without any reported sense of palpitations or chest pain, no unusual shortness of breath.  He states that he has tolerated Xarelto since initiation by Dr. Willey Blade.  He does not report any stool changes or obvious bleeding in the last month.  Actually, he is due for follow-up lab work later this week with a pending visit per Dr. Willey Blade.  He is not on any heart rate control medications.  I reviewed his recent ECGs as well as echocardiogram done back in October 2018.  LVEF was normal range at 55-60% and he had no major valvular abnormalities.  He has had an indwelling urinary catheter for quite some time now, states that he is to undergo prostatectomy later this month.  Past Medical History:  Diagnosis Date  . Atrial fibrillation ([place])   . History of colonic polyps   . History of GI bleed    December 2018  . History of kidney stones   . Obstructive uropathy     Past Surgical History:  Procedure Laterality Date  . COLONOSCOPY N/A 01/11/2017   Procedure: COLONOSCOPY;  Surgeon: Ronnette Juniper, MD;  [facility];  Service: Gastroenterology;  Laterality: N/A;  . CYSTOSCOPY WITH RETROGRADE PYELOGRAM, URETEROSCOPY AND STENT PLACEMENT Right 11/21/2016   Procedure: CYSTOSCOPY WITH RETROGRADE PYELOGRAM, URETEROSCOPY AND STENT PLACEMENT;  Surgeon: Cleon Gustin, MD;  [facility];  Service: Urology;  Laterality: Right;  . ESOPHAGOGASTRODUODENOSCOPY N/A 01/10/2017   Procedure: ESOPHAGOGASTRODUODENOSCOPY (EGD);  Surgeon: Laurence Spates, MD;  Location: [facility];  Service:  Endoscopy;  Laterality: N/A;  . HERNIA REPAIR  1974  . HOLMIUM LASER APPLICATION Right 01/0/9323   Procedure: HOLMIUM LASER APPLICATION;  Surgeon: Cleon Gustin, MD;  [facility];  Service: Urology;  Laterality: Right;  . IRRIGATION AND DEBRIDEMENT ABSCESS Right 12/26/2016   Procedure: RIGHT SCROTAL EXPLORATION, RIGHT ORCHIECTOMY;  Surgeon: Lucas Mallow, MD;  [facility];  Service: Urology;  Laterality: Right;  . ORCHIECTOMY Right 12/26/2016   Procedure: POSSIBLE RIGHT ORCHIECTOMY;  Surgeon: Lucas Mallow, MD;  [facility];  Service: Urology;  Laterality: Right;    Current Outpatient Medications  Medication Sig Dispense Refill  . rivaroxaban (XARELTO) 20 MG TABS tablet Take 1 tablet (20 mg total) by mouth daily with supper. 30 tablet 0   No current facility-administered medications for this visit.    Allergies:  Flomax [tamsulosin hcl]   Social History: The patient  reports that he quit smoking about 46 years ago. His smoking use included cigarettes. he has never used smokeless tobacco. He reports that he drinks alcohol. He reports that he does not use drugs.   Family History: The patient's family history includes CAD in his father; Dementia in his mother; Heart attack in his brother; Lung cancer in his father.   ROS:  Please see the history of present illness. Otherwise, complete review of systems is positive for chronic indwelling urinary catheter due to urinary retention.  All other systems are reviewed and negative.   Physical Exam: VS:  BP (!) 152/90 (BP Location: Right Arm)  Pulse 76   Ht 5\' 11"  (1.803 m)   Wt 200 lb (90.7 kg)   SpO2 97%   BMI 27.89 kg/m , BMI Body mass index is 27.89 kg/m.  Wt Readings from Last 3 Encounters:  03/04/17 200 lb (90.7 kg)  01/10/17 175 lb (79.4 kg)  12/26/16 180 lb 6.4 oz (81.8 kg)    General: Elderly male, appears comfortable at rest. HEENT: Conjunctiva and lids normal, oropharynx clear. Neck: Supple, no  elevated JVP or carotid bruits, no thyromegaly. Lungs: Clear to auscultation, nonlabored breathing at rest. Cardiac: Irregularly irregular, no S3 or significant systolic murmur, no pericardial rub. Abdomen: Soft, nontender, bowel sounds present. Extremities: No pitting edema, distal pulses 2+. Skin: Warm and dry. Musculoskeletal: No kyphosis. Neuropsychiatric: Alert and oriented x3, affect grossly appropriate.  ECG: I personally reviewed the tracing from 01/09/2017 which showed atrial fibrillation at 115 bpm, borderline low voltage and nonspecific T wave changes.  Recent Labwork: 11/05/2016: TSH 0.588 01/09/2017: ALT 23; AST 28 01/13/2017: BUN 11; Creatinine, Ser 0.69; Hemoglobin 8.3; Magnesium 1.8; Platelets 392; Potassium 3.4; Sodium 136   Other Studies Reviewed Today:  Echocardiogram 11/06/2016: Study Conclusions  - Left ventricle: The cavity size was normal. Wall thickness was   increased in a pattern of mild LVH. Systolic function was normal.   The estimated ejection fraction was in the range of 55% to 60%.   Wall motion was normal; there were no regional wall motion   abnormalities. The study is not technically sufficient to allow   evaluation of LV diastolic function. - Aortic valve: There was trivial regurgitation. - Mitral valve: Mildly calcified annulus. There was trivial   regurgitation. - Left atrium: The atrium was at the upper limits of normal in   size. - Right atrium: Central venous pressure (est): 3 mm Hg. - Tricuspid valve: There was trivial regurgitation. - Pulmonary arteries: Systolic pressure could not be accurately   estimated. - Pericardium, extracardiac: There was no pericardial effusion.  Impressions:  - Mild LVH with LVEF 55-60% and indeterminate diastolic function.   Upper normal left atrial chamber size. Mildly calcified mitral   annulus with trivial mitral regurgitation. Trivial aortic   regurgitation. Trivial tricuspid  regurgitation.  Assessment and Plan:  1.  Persistent atrial fibrillation, asymptomatic in terms of palpitations and not requiring heart rate control medication at this time.  He is on Xarelto for stroke prophylaxis with CHADSVASC score of 3.  2.  History of GI bleed in December 2018.  No obvious recurrence at this time.  He is having lab work followed by Dr. Willey Blade.  3.  Obstructive uropathy, indwelling Foley catheter in place with plan for prostatectomy later this month.  He will need to come off of Xarelto for 48 hours prior to the procedure.  4.  Elevated blood pressure.  Has not had regular medical follow-up for several years.  Maintain assessment by Dr. Willey Blade, may need to consider an antihypertensive agent.  Current medicines were reviewed with the patient today.  Disposition: Follow-up in 3 months.  Signed, Satira Sark, MD, [facility] 03/04/2017 11:00 AM    [place] [facility] [address] [place], [place] [address] Phone: [phone]; Fax: [phone]

## 2017-03-04 ENCOUNTER — Ambulatory Visit: Payer: Medicare Other | Admitting: Cardiology

## 2017-03-04 ENCOUNTER — Encounter: Payer: Self-pay | Admitting: Cardiology

## 2017-03-04 VITALS — BP 152/90 | HR 76 | Ht 71.0 in | Wt 200.0 lb

## 2017-03-04 DIAGNOSIS — I482 Chronic atrial fibrillation, unspecified: Secondary | ICD-10-CM

## 2017-03-04 DIAGNOSIS — N139 Obstructive and reflux uropathy, unspecified: Secondary | ICD-10-CM

## 2017-03-04 DIAGNOSIS — R030 Elevated blood-pressure reading, without diagnosis of hypertension: Secondary | ICD-10-CM

## 2017-03-04 DIAGNOSIS — Z8719 Personal history of other diseases of the digestive system: Secondary | ICD-10-CM

## 2017-03-04 NOTE — Patient Instructions (Addendum)
Your physician wants you to follow-up in:3 months with Dr.McDowell   Your physician recommends that you continue on your current medications as directed. Please refer to the Current Medication list given to you today.   If you need a refill on your cardiac medications before your next appointment, please call your pharmacy.   No lab work or tests ordered     Thank you for choosing [facility] !

## 2017-03-28 ENCOUNTER — Encounter (HOSPITAL_COMMUNITY): Payer: Self-pay

## 2017-03-28 NOTE — Patient Instructions (Signed)
Your procedure is scheduled on: Monday, April 14, 2017   Surgery Time:  7:30AM-10:30AM   Report to [place] by 5:30 AM pick up the phone at the front desk dial [phone], have a seat in the lobby and a staff member will come and escort you to Short Stay Department   Call this number if you have problems the morning of surgery [phone]   The day before surgery switch to liquid diet     [place] Excluded  Coffee and tea, regular and decaf                             liquids that you cannot  Plain Jell-O in any flavor                                             see through such as: Fruit ices (not with fruit pulp)                                     milk, soups, orange juice  Iced Popsicles                                    All solid food Carbonated beverages, regular and diet                                    Cranberry, grape and apple juices Sports drinks like Gatorade Lightly seasoned clear broth or consume(fat free) Sugar, honey syrup  Sample Menu Breakfast                                Lunch                                     Supper Cranberry juice                    Beef broth                            Chicken broth Jell-O                                     Grape juice                           Apple juice Coffee or  tea                        Jell-O                                      Popsicle                                                Coffee or tea                        Coffee or tea   Magnesium Citrate: Drink 1 bottle by noon the day before surgery.   Do not eat food or drink liquids :After Midnight.   Do NOT smoke after Midnight   Take these medicines the morning of surgery with A SIP OF WATER: None                               You may not have any metal on your body including  jewelry, and body piercings       Do not wear lotions, powders, perfumes/cologne, or deodorant                          Men may shave face and neck.   Do not bring valuables to the hospital. [place].   Contacts, dentures or bridgework may not be worn into surgery.   Leave suitcase in the car. After surgery it may be brought to your room.   Special Instructions: Bring a copy of your healthcare power of attorney and living will documents         the day of surgery if you haven't scanned them in before.              Please read over the following fact sheets you were given:  [facility] - Preparing for Surgery Before surgery, you can play an important role.  Because skin is not sterile, your skin needs to be as free of germs as possible.  You can reduce the number of germs on your skin by washing with CHG (chlorahexidine gluconate) soap before surgery.  CHG is an antiseptic cleaner which kills germs and bonds with the skin to continue killing germs even after washing. Please DO NOT use if you have an allergy to CHG or antibacterial soaps.  If your skin becomes reddened/irritated stop using the CHG and inform your nurse when you arrive at Short Stay. Do not shave (including legs and underarms) for at least 48 hours prior to the first CHG shower.  You may shave your face/neck.  Please follow these instructions carefully:  1.  Shower with CHG Soap the night before surgery and the  morning of surgery.  2.  If you choose to wash your hair, wash your hair first as usual with your normal  shampoo.  3.  After you shampoo, rinse your hair and body thoroughly to remove the shampoo.  4.  Use CHG as you would any other liquid soap.  You can apply chg directly to the skin and wash.  Gently with a scrungie or clean washcloth.  5.  Apply the CHG Soap to your body ONLY FROM THE NECK DOWN.   Do   not use on face/ open                           Wound or open  sores. Avoid contact with eyes, ears mouth and   genitals (private parts).                       Wash face,  Genitals (private parts) with your normal soap.             6.  Wash thoroughly, paying special attention to the area where your    surgery  will be performed.  7.  Thoroughly rinse your body with warm water from the neck down.  8.  DO NOT shower/wash with your normal soap after using and rinsing off the CHG Soap.                9.  Pat yourself dry with a clean towel.            10.  Wear clean pajamas.            11.  Place clean sheets on your bed the night of your first shower and do not  sleep with pets. Day of Surgery : Do not apply any lotions/deodorants the morning of surgery.  Please wear clean clothes to the hospital/surgery center.  FAILURE TO FOLLOW THESE INSTRUCTIONS MAY RESULT IN THE CANCELLATION OF YOUR SURGERY  PATIENT SIGNATURE_________________________________  NURSE SIGNATURE__________________________________  ________________________________________________________________________  WHAT IS A BLOOD TRANSFUSION? Blood Transfusion Information  A transfusion is the replacement of blood or some of its parts. Blood is made up of multiple cells which provide different functions.  Red blood cells carry oxygen and are used for blood loss replacement.  White blood cells fight against infection.  Platelets control bleeding.  Plasma helps clot blood.  Other blood products are available for specialized needs, such as hemophilia or other clotting disorders. BEFORE THE TRANSFUSION  Who gives blood for transfusions?   Healthy volunteers who are fully evaluated to make sure their blood is safe. This is blood bank blood. Transfusion therapy is the safest it has ever been in the practice of medicine. Before blood is taken from a donor, a complete history is taken to make sure that person has no history of diseases nor engages in risky social behavior (examples are  intravenous drug use or sexual activity with multiple partners). The donor's travel history is screened to minimize risk of transmitting infections, such as malaria. The donated blood is tested for signs of infectious diseases, such as HIV and hepatitis. The blood is then tested to be sure it is compatible with you in order to minimize the chance of a transfusion reaction. If you or a relative donates blood, this is often done in anticipation of surgery and is not appropriate for emergency situations. It takes many days to process the donated blood. RISKS AND COMPLICATIONS Although transfusion therapy is very safe and saves many lives, the main dangers of transfusion include:   Getting an infectious disease.  Developing a transfusion reaction. This is an allergic reaction to something in the blood you were given. Every precaution  is taken to prevent this. The decision to have a blood transfusion has been considered carefully by your caregiver before blood is given. Blood is not given unless the benefits outweigh the risks. AFTER THE TRANSFUSION  Right after receiving a blood transfusion, you will usually feel much better and more energetic. This is especially true if your red blood cells have gotten low (anemic). The transfusion raises the level of the red blood cells which carry oxygen, and this usually causes an energy increase.  The nurse administering the transfusion will monitor you carefully for complications. HOME CARE INSTRUCTIONS  No special instructions are needed after a transfusion. You may find your energy is better. Speak with your caregiver about any limitations on activity for underlying diseases you may have. SEEK MEDICAL CARE IF:   Your condition is not improving after your transfusion.  You develop redness or irritation at the intravenous (IV) site. SEEK IMMEDIATE MEDICAL CARE IF:  Any of the following symptoms occur over the next 12 hours:  Shaking chills.  You have a  temperature by mouth above 102 F (38.9 C), not controlled by medicine.  Chest, back, or muscle pain.  People around you feel you are not acting correctly or are confused.  Shortness of breath or difficulty breathing.  Dizziness and fainting.  You get a rash or develop hives.  You have a decrease in urine output.  Your urine turns a dark color or changes to pink, red, or brown. Any of the following symptoms occur over the next 10 days:  You have a temperature by mouth above 102 F (38.9 C), not controlled by medicine.  Shortness of breath.  Weakness after normal activity.  The white part of the eye turns yellow (jaundice).  You have a decrease in the amount of urine or are urinating less often.  Your urine turns a dark color or changes to pink, red, or brown. Document Released: 01/05/2000 Document Revised: 04/01/2011 Document Reviewed: 08/24/2007 [facility] Patient Information 2014 [place], [place].  _______________________________________________________________________

## 2017-03-28 NOTE — Pre-Procedure Instructions (Signed)
The following are in epic: Last office visit note 03/04/17 EKG 12/2016 CT 01/09/2017 CXR 01/09/2017 ECHO 11/06/2016

## 2017-04-01 ENCOUNTER — Encounter (HOSPITAL_COMMUNITY)
Admission: RE | Admit: 2017-04-01 | Discharge: 2017-04-01 | Disposition: A | Discharge status: Home / Self Care | Payer: Medicare Other | Source: Ambulatory Visit | Attending: Urology | Admitting: Urology

## 2017-04-01 ENCOUNTER — Other Ambulatory Visit: Payer: Self-pay

## 2017-04-01 ENCOUNTER — Encounter (HOSPITAL_COMMUNITY): Payer: Self-pay

## 2017-04-01 DIAGNOSIS — Z01812 Encounter for preprocedural laboratory examination: Secondary | ICD-10-CM | POA: Insufficient documentation

## 2017-04-01 DIAGNOSIS — I4891 Unspecified atrial fibrillation: Secondary | ICD-10-CM | POA: Insufficient documentation

## 2017-04-01 DIAGNOSIS — D291 Benign neoplasm of prostate: Secondary | ICD-10-CM | POA: Diagnosis not present

## 2017-04-01 DIAGNOSIS — Z01810 Encounter for preprocedural cardiovascular examination: Secondary | ICD-10-CM | POA: Insufficient documentation

## 2017-04-01 DIAGNOSIS — R339 Retention of urine, unspecified: Secondary | ICD-10-CM | POA: Diagnosis not present

## 2017-04-01 DIAGNOSIS — R8271 Bacteriuria: Secondary | ICD-10-CM | POA: Diagnosis not present

## 2017-04-01 HISTORY — DX: Cardiomegaly: I51.7

## 2017-04-01 HISTORY — DX: Other specified postprocedural states: Z98.890

## 2017-04-01 HISTORY — DX: Personal history of other medical treatment: Z92.89

## 2017-04-01 HISTORY — DX: Benign prostatic hyperplasia without lower urinary tract symptoms: N40.0

## 2017-04-01 HISTORY — DX: Nonrheumatic mitral (valve) insufficiency: I34.0

## 2017-04-01 HISTORY — DX: Anemia, unspecified: D64.9

## 2017-04-01 HISTORY — DX: Other specified diseases of liver: K76.89

## 2017-04-01 HISTORY — DX: Personal history of other diseases of male genital organs: Z87.438

## 2017-04-01 HISTORY — DX: Nonrheumatic aortic (valve) insufficiency: I35.1

## 2017-04-01 HISTORY — DX: Abnormal weight loss: R63.4

## 2017-04-01 HISTORY — DX: Atherosclerosis of aorta: I70.0

## 2017-04-01 HISTORY — DX: Unilateral inguinal hernia, without obstruction or gangrene, not specified as recurrent: K40.90

## 2017-04-01 HISTORY — DX: Dizziness and giddiness: R42

## 2017-04-01 HISTORY — DX: Reserved for concepts with insufficient information to code with codable children: IMO0002

## 2017-04-01 HISTORY — DX: Nausea: R11.0

## 2017-04-01 HISTORY — DX: Rheumatic tricuspid insufficiency: I07.1

## 2017-04-01 HISTORY — DX: Calculus of gallbladder without cholecystitis without obstruction: K80.20

## 2017-04-01 LAB — CBC
HCT: 43.4 % (ref 39.0–52.0)
HEMOGLOBIN: 14.2 g/dL (ref 13.0–17.0)
MCH: 31 pg (ref 26.0–34.0)
MCHC: 32.7 g/dL (ref 30.0–36.0)
MCV: 94.8 fL (ref 78.0–100.0)
PLATELETS: 234 10*3/uL (ref 150–400)
RBC: 4.58 MIL/uL (ref 4.22–5.81)
RDW: 13.4 % (ref 11.5–15.5)
WBC: 7.2 10*3/uL (ref 4.0–10.5)

## 2017-04-01 MED ORDER — MAGNESIUM CITRATE PO SOLN: 1.0000 | Freq: Once | ORAL | Status: DC

## 2017-04-11 NOTE — Anesthesia Preprocedure Evaluation (Signed)
Anesthesia Evaluation  Patient identified by MRN, date of birth, ID band Patient awake    Reviewed: Allergy & Precautions, H&P , NPO status , Patient's Chart, lab work & pertinent test results  Airway Mallampati: II  TM Distance: >3 FB Neck ROM: full    Dental no notable dental hx.    Pulmonary neg pulmonary ROS, former smoker,    Pulmonary exam normal breath sounds clear to auscultation       Cardiovascular + dysrhythmias Atrial Fibrillation  Rhythm:regular Rate:Normal  Impressions:  - Mild LVH with LVEF 55-60% and indeterminate diastolic function.   Upper normal left atrial chamber size. Mildly calcified mitral   annulus with trivial mitral regurgitation. Trivial aortic   regurgitation. Trivial tricuspid regurgitation.    Neuro/Psych negative neurological ROS  negative psych ROS   GI/Hepatic negative GI ROS, Neg liver ROS,   Endo/Other  negative endocrine ROS  Renal/GU ARFRenal disease  negative genitourinary   Musculoskeletal negative musculoskeletal ROS (+)   Abdominal   Peds negative pediatric ROS (+)  Hematology negative hematology ROS (+)   Anesthesia Other Findings T  Reproductive/Obstetrics negative OB ROS                             Anesthesia Physical  Anesthesia Plan  ASA: III  Anesthesia Plan: General   Post-op Pain Management:    Induction: Intravenous  PONV Risk Score and Plan: 1 and Ondansetron, Dexamethasone and Treatment may vary due to age or medical condition  Airway Management Planned: Oral ETT  Additional Equipment:   Intra-op Plan:   Post-operative Plan: Extubation in OR  Informed Consent: I have reviewed the patients History and Physical, chart, labs and discussed the procedure including the risks, benefits and alternatives for the proposed anesthesia with the patient or authorized representative who has indicated his/her understanding and  acceptance.   Dental Advisory Given  Plan Discussed with: CRNA, Surgeon and Anesthesiologist  Anesthesia Plan Comments: (  )        Anesthesia Quick Evaluation

## 2017-04-14 ENCOUNTER — Inpatient Hospital Stay (HOSPITAL_COMMUNITY): Payer: Medicare Other | Admitting: Certified Registered Nurse Anesthetist

## 2017-04-14 ENCOUNTER — Other Ambulatory Visit: Payer: Self-pay

## 2017-04-14 ENCOUNTER — Encounter (HOSPITAL_COMMUNITY): Payer: Self-pay | Admitting: *Deleted

## 2017-04-14 ENCOUNTER — Encounter (HOSPITAL_COMMUNITY)
Admission: RE | Disposition: A | Discharge status: Home / Self Care | Payer: Self-pay | Source: Ambulatory Visit | Attending: Urology

## 2017-04-14 ENCOUNTER — Inpatient Hospital Stay (HOSPITAL_COMMUNITY)
Admission: RE | Admit: 2017-04-14 | Discharge: 2017-04-19 | DRG: 718 | Disposition: A | Discharge status: Home / Self Care | Payer: Medicare Other | Source: Ambulatory Visit | Attending: Urology | Admitting: Urology

## 2017-04-14 DIAGNOSIS — R509 Fever, unspecified: Secondary | ICD-10-CM | POA: Diagnosis not present

## 2017-04-14 DIAGNOSIS — Z888 Allergy status to other drugs, medicaments and biological substances status: Secondary | ICD-10-CM | POA: Diagnosis not present

## 2017-04-14 DIAGNOSIS — I700 Atherosclerosis of aorta: Secondary | ICD-10-CM | POA: Diagnosis not present

## 2017-04-14 DIAGNOSIS — Z87442 Personal history of urinary calculi: Secondary | ICD-10-CM | POA: Diagnosis not present

## 2017-04-14 DIAGNOSIS — N138 Other obstructive and reflux uropathy: Secondary | ICD-10-CM | POA: Diagnosis present

## 2017-04-14 DIAGNOSIS — Z87891 Personal history of nicotine dependence: Secondary | ICD-10-CM | POA: Diagnosis not present

## 2017-04-14 DIAGNOSIS — J9811 Atelectasis: Secondary | ICD-10-CM | POA: Diagnosis not present

## 2017-04-14 DIAGNOSIS — R338 Other retention of urine: Secondary | ICD-10-CM | POA: Diagnosis not present

## 2017-04-14 DIAGNOSIS — N401 Enlarged prostate with lower urinary tract symptoms: Secondary | ICD-10-CM | POA: Diagnosis not present

## 2017-04-14 DIAGNOSIS — R3915 Urgency of urination: Secondary | ICD-10-CM | POA: Diagnosis not present

## 2017-04-14 DIAGNOSIS — N400 Enlarged prostate without lower urinary tract symptoms: Secondary | ICD-10-CM | POA: Diagnosis not present

## 2017-04-14 DIAGNOSIS — I4891 Unspecified atrial fibrillation: Secondary | ICD-10-CM | POA: Diagnosis not present

## 2017-04-14 DIAGNOSIS — Z86010 Personal history of colonic polyps: Secondary | ICD-10-CM

## 2017-04-14 DIAGNOSIS — D62 Acute posthemorrhagic anemia: Secondary | ICD-10-CM | POA: Diagnosis not present

## 2017-04-14 DIAGNOSIS — A419 Sepsis, unspecified organism: Secondary | ICD-10-CM

## 2017-04-14 HISTORY — PX: XI ROBOTIC ASSISTED SIMPLE PROSTATECTOMY: SHX6713

## 2017-04-14 LAB — CBC
HEMATOCRIT: 39.1 % (ref 39.0–52.0)
HEMATOCRIT: 36.5 % — AB (ref 39.0–52.0)
HEMOGLOBIN: 12.8 g/dL — AB (ref 13.0–17.0)
HEMOGLOBIN: 11.7 g/dL — AB (ref 13.0–17.0)
MCH: 30.2 pg (ref 26.0–34.0)
MCH: 29.9 pg (ref 26.0–34.0)
MCHC: 32.7 g/dL (ref 30.0–36.0)
MCHC: 32.1 g/dL (ref 30.0–36.0)
MCV: 92.2 fL (ref 78.0–100.0)
MCV: 93.4 fL (ref 78.0–100.0)
Platelets: 224 10*3/uL (ref 150–400)
Platelets: 217 10*3/uL (ref 150–400)
RBC: 4.24 MIL/uL (ref 4.22–5.81)
RBC: 3.91 MIL/uL — ABNORMAL LOW (ref 4.22–5.81)
RDW: 13.1 % (ref 11.5–15.5)
RDW: 13.4 % (ref 11.5–15.5)
WBC: 22 10*3/uL — ABNORMAL HIGH (ref 4.0–10.5)
WBC: 15.7 10*3/uL — ABNORMAL HIGH (ref 4.0–10.5)

## 2017-04-14 LAB — CREATININE, SERUM
Creatinine, Ser: 1.12 mg/dL (ref 0.61–1.24)
GFR calc Af Amer: >60 mL/min (ref >60)
GFR, EST NON AFRICAN AMERICAN: 59 mL/min — AB (ref >60)

## 2017-04-14 LAB — TYPE AND SCREEN
ABO/RH(D): A NEG
ANTIBODY SCREEN: NEGATIVE

## 2017-04-14 SURGERY — PROSTATECTOMY, SIMPLE, ROBOT-ASSISTED
Anesthesia: General

## 2017-04-14 MED ORDER — ONDANSETRON HCL 4 MG/2ML IJ SOLN: 4.0000 mg | INTRAMUSCULAR | Status: DC | PRN

## 2017-04-14 MED ORDER — CEFAZOLIN SODIUM-DEXTROSE 2-4 GM/100ML-% IV SOLN
2.0000 g | INTRAVENOUS | Status: AC
  Administered 2017-04-14: 2 g via INTRAVENOUS
  Filled 2017-04-14: qty 100

## 2017-04-14 MED ORDER — DIPHENHYDRAMINE HCL 50 MG/ML IJ SOLN: 12.5000 mg | Freq: Four times a day (QID) | INTRAMUSCULAR | Status: DC | PRN

## 2017-04-14 MED ORDER — FENTANYL CITRATE (PF) 100 MCG/2ML IJ SOLN
INTRAMUSCULAR | Status: AC
  Filled 2017-04-14: qty 2

## 2017-04-14 MED ORDER — HEPARIN SODIUM (PORCINE) 5000 UNIT/ML IJ SOLN
5000.0000 [IU] | Freq: Three times a day (TID) | INTRAMUSCULAR | Status: DC
  Administered 2017-04-14 – 2017-04-19 (×14): 5000 [IU] via SUBCUTANEOUS
  Filled 2017-04-14 (×14): qty 1

## 2017-04-14 MED ORDER — ALBUMIN HUMAN 5 % IV SOLN
INTRAVENOUS | Status: AC
  Filled 2017-04-14: qty 250

## 2017-04-14 MED ORDER — ROCURONIUM BROMIDE 10 MG/ML (PF) SYRINGE
PREFILLED_SYRINGE | INTRAVENOUS | Status: DC | PRN
  Administered 2017-04-14 (×2): 10 mg via INTRAVENOUS
  Administered 2017-04-14: 50 mg via INTRAVENOUS
  Administered 2017-04-14: 20 mg via INTRAVENOUS
  Administered 2017-04-14: 10 mg via INTRAVENOUS

## 2017-04-14 MED ORDER — OXYCODONE HCL 5 MG PO TABS
5.0000 mg | ORAL_TABLET | ORAL | Status: DC | PRN
  Administered 2017-04-17 (×2): 5 mg via ORAL
  Filled 2017-04-14 (×2): qty 1

## 2017-04-14 MED ORDER — METOPROLOL TARTRATE 5 MG/5ML IV SOLN
INTRAVENOUS | Status: AC
  Filled 2017-04-14: qty 5

## 2017-04-14 MED ORDER — SODIUM CHLORIDE 0.9 % IJ SOLN
INTRAMUSCULAR | Status: AC
  Filled 2017-04-14: qty 20

## 2017-04-14 MED ORDER — PROMETHAZINE HCL 25 MG/ML IJ SOLN
INTRAMUSCULAR | Status: AC
  Administered 2017-04-14: 6.25 mg
  Filled 2017-04-14: qty 1

## 2017-04-14 MED ORDER — LACTATED RINGERS IR SOLN
Status: DC | PRN
  Administered 2017-04-14: 1000 mL

## 2017-04-14 MED ORDER — HYDROCODONE-ACETAMINOPHEN 5-325 MG PO TABS: 1.0000 | ORAL_TABLET | Freq: Four times a day (QID) | ORAL | 0 refills | Status: AC | PRN

## 2017-04-14 MED ORDER — SODIUM CHLORIDE 0.9 % IR SOLN
3000.0000 mL | Status: DC
  Administered 2017-04-14 – 2017-04-17 (×9): 3000 mL

## 2017-04-14 MED ORDER — BUPIVACAINE LIPOSOME 1.3 % IJ SUSP
20.0000 mL | Freq: Once | INTRAMUSCULAR | Status: AC
  Administered 2017-04-14: 20 mL
  Filled 2017-04-14 (×2): qty 20

## 2017-04-14 MED ORDER — MEPERIDINE HCL 50 MG/ML IJ SOLN: 6.2500 mg | INTRAMUSCULAR | Status: DC | PRN

## 2017-04-14 MED ORDER — FENTANYL CITRATE (PF) 100 MCG/2ML IJ SOLN
25.0000 ug | INTRAMUSCULAR | Status: DC | PRN
  Administered 2017-04-14 (×2): 50 ug via INTRAVENOUS

## 2017-04-14 MED ORDER — HYDRALAZINE HCL 20 MG/ML IJ SOLN
INTRAMUSCULAR | Status: AC
  Filled 2017-04-14: qty 1

## 2017-04-14 MED ORDER — ESMOLOL HCL 100 MG/10ML IV SOLN
INTRAVENOUS | Status: DC | PRN
  Administered 2017-04-14: 30 mg via INTRAVENOUS

## 2017-04-14 MED ORDER — PROPOFOL 10 MG/ML IV BOLUS
INTRAVENOUS | Status: AC
Start: 2017-04-14 — End: 2017-04-14
  Filled 2017-04-14: qty 40

## 2017-04-14 MED ORDER — SODIUM CHLORIDE 0.9 % IJ SOLN
INTRAMUSCULAR | Status: DC | PRN
  Administered 2017-04-14: 20 mL

## 2017-04-14 MED ORDER — SULFAMETHOXAZOLE-TRIMETHOPRIM 800-160 MG PO TABS: 1.0000 | ORAL_TABLET | Freq: Two times a day (BID) | ORAL | 0 refills | Status: AC

## 2017-04-14 MED ORDER — HYDROMORPHONE HCL 1 MG/ML IJ SOLN
0.5000 mg | INTRAMUSCULAR | Status: DC | PRN
  Administered 2017-04-17 (×2): 1 mg via INTRAVENOUS
  Filled 2017-04-14 (×2): qty 1

## 2017-04-14 MED ORDER — SUGAMMADEX SODIUM 200 MG/2ML IV SOLN
INTRAVENOUS | Status: AC
  Filled 2017-04-14: qty 2

## 2017-04-14 MED ORDER — ONDANSETRON HCL 4 MG/2ML IJ SOLN
INTRAMUSCULAR | Status: AC
  Filled 2017-04-14: qty 2

## 2017-04-14 MED ORDER — METOPROLOL TARTRATE 5 MG/5ML IV SOLN
INTRAVENOUS | Status: DC | PRN
  Administered 2017-04-14: 2 mg via INTRAVENOUS

## 2017-04-14 MED ORDER — PROPOFOL 10 MG/ML IV BOLUS
INTRAVENOUS | Status: DC | PRN
  Administered 2017-04-14: 110 mg via INTRAVENOUS
  Administered 2017-04-14: 40 mg via INTRAVENOUS
  Administered 2017-04-14: 50 mg via INTRAVENOUS

## 2017-04-14 MED ORDER — LIDOCAINE 2% (20 MG/ML) 5 ML SYRINGE
INTRAMUSCULAR | Status: DC | PRN
  Administered 2017-04-14: 80 mg via INTRAVENOUS

## 2017-04-14 MED ORDER — HYDRALAZINE HCL 20 MG/ML IJ SOLN
INTRAMUSCULAR | Status: DC | PRN
  Administered 2017-04-14 (×2): 4 mg via INTRAVENOUS

## 2017-04-14 MED ORDER — PROMETHAZINE HCL 25 MG/ML IJ SOLN: 6.2500 mg | INTRAMUSCULAR | Status: DC | PRN

## 2017-04-14 MED ORDER — SUCCINYLCHOLINE CHLORIDE 200 MG/10ML IV SOSY
PREFILLED_SYRINGE | INTRAVENOUS | Status: DC | PRN
  Administered 2017-04-14: 100 mg via INTRAVENOUS

## 2017-04-14 MED ORDER — FENTANYL CITRATE (PF) 250 MCG/5ML IJ SOLN
INTRAMUSCULAR | Status: AC
Start: 2017-04-14 — End: 2017-04-14
  Filled 2017-04-14: qty 5

## 2017-04-14 MED ORDER — DEXTROSE-NACL 5-0.45 % IV SOLN
INTRAVENOUS | Status: DC
  Administered 2017-04-14 – 2017-04-18 (×6): via INTRAVENOUS

## 2017-04-14 MED ORDER — DIPHENHYDRAMINE HCL 12.5 MG/5ML PO ELIX: 12.5000 mg | ORAL_SOLUTION | Freq: Four times a day (QID) | ORAL | Status: DC | PRN

## 2017-04-14 MED ORDER — DEXAMETHASONE SODIUM PHOSPHATE 10 MG/ML IJ SOLN
INTRAMUSCULAR | Status: AC
  Filled 2017-04-14: qty 1

## 2017-04-14 MED ORDER — SUGAMMADEX SODIUM 200 MG/2ML IV SOLN
INTRAVENOUS | Status: DC | PRN
  Administered 2017-04-14: 300 mg via INTRAVENOUS

## 2017-04-14 MED ORDER — METOPROLOL TARTRATE 5 MG/5ML IV SOLN
5.0000 mg | Freq: Once | INTRAVENOUS | Status: AC
  Administered 2017-04-14: 5 mg via INTRAVENOUS

## 2017-04-14 MED ORDER — LACTATED RINGERS IV SOLN
INTRAVENOUS | Status: DC
  Administered 2017-04-14 (×3): via INTRAVENOUS

## 2017-04-14 MED ORDER — ALBUMIN HUMAN 5 % IV SOLN
INTRAVENOUS | Status: DC | PRN
  Administered 2017-04-14: 10:00:00 via INTRAVENOUS

## 2017-04-14 MED ORDER — FENTANYL CITRATE (PF) 100 MCG/2ML IJ SOLN
INTRAMUSCULAR | Status: DC | PRN
  Administered 2017-04-14 (×9): 50 ug via INTRAVENOUS

## 2017-04-14 MED ORDER — ACETAMINOPHEN 325 MG PO TABS
650.0000 mg | ORAL_TABLET | ORAL | Status: DC | PRN
  Administered 2017-04-14 – 2017-04-19 (×10): 650 mg via ORAL
  Filled 2017-04-14 (×10): qty 2

## 2017-04-14 SURGICAL SUPPLY — 58 items
APPLICATOR COTTON TIP 6IN STRL (MISCELLANEOUS) ×3 IMPLANT
CATH FOLEY 2WAY SLVR  5CC 18FR (CATHETERS)
CATH FOLEY 2WAY SLVR 5CC 18FR (CATHETERS) IMPLANT
CATH FOLEY 3WAY 30CC 22FR (CATHETERS) ×3 IMPLANT
CHLORAPREP W/TINT 26ML (MISCELLANEOUS) ×3 IMPLANT
CLOTH BEACON ORANGE TIMEOUT ST (SAFETY) ×3 IMPLANT
COVER SURGICAL LIGHT HANDLE (MISCELLANEOUS) ×3 IMPLANT
COVER TIP SHEARS 8 DVNC (MISCELLANEOUS) ×1 IMPLANT
COVER TIP SHEARS 8MM DA VINCI (MISCELLANEOUS) ×2
DECANTER SPIKE VIAL GLASS SM (MISCELLANEOUS) ×3 IMPLANT
DERMABOND ADVANCED (GAUZE/BANDAGES/DRESSINGS) ×2
DERMABOND ADVANCED .7 DNX12 (GAUZE/BANDAGES/DRESSINGS) ×1 IMPLANT
DRAPE ARM DVNC X/XI (DISPOSABLE) ×4 IMPLANT
DRAPE COLUMN DVNC XI (DISPOSABLE) ×1 IMPLANT
DRAPE DA VINCI XI ARM (DISPOSABLE) ×8
DRAPE DA VINCI XI COLUMN (DISPOSABLE) ×2
DRAPE SURG IRRIG POUCH 19X23 (DRAPES) ×3 IMPLANT
DRSG TEGADERM 4X4.75 (GAUZE/BANDAGES/DRESSINGS) ×3 IMPLANT
ELECT PENCIL ROCKER SW 15FT (MISCELLANEOUS) ×3 IMPLANT
ELECT REM PT RETURN 15FT ADLT (MISCELLANEOUS) ×3 IMPLANT
GAUZE SPONGE 2X2 8PLY STRL LF (GAUZE/BANDAGES/DRESSINGS) IMPLANT
GLOVE BIO SURGEON STRL SZ 6.5 (GLOVE) ×2 IMPLANT
GLOVE BIO SURGEON STRL SZ8 (GLOVE) ×6 IMPLANT
GLOVE BIO SURGEONS STRL SZ 6.5 (GLOVE) ×1
GLOVE BIOGEL PI IND STRL 8 (GLOVE) ×2 IMPLANT
GLOVE BIOGEL PI INDICATOR 8 (GLOVE) ×4
GOWN STRL REUS W/TWL LRG LVL3 (GOWN DISPOSABLE) ×9 IMPLANT
HEMOSTAT SURGICEL 4X8 (HEMOSTASIS) ×3 IMPLANT
HOLDER FOLEY CATH W/STRAP (MISCELLANEOUS) ×3 IMPLANT
IRRIG SUCT STRYKERFLOW 2 WTIP (MISCELLANEOUS) ×3
IRRIGATION SUCT STRKRFLW 2 WTP (MISCELLANEOUS) ×1 IMPLANT
IV LACTATED RINGERS 1000ML (IV SOLUTION) ×3 IMPLANT
NEEDLE INSUFFLATION 14GA 120MM (NEEDLE) ×3 IMPLANT
PACK ROBOT UROLOGY CUSTOM (CUSTOM PROCEDURE TRAY) ×3 IMPLANT
PAD POSITIONING PINK XL (MISCELLANEOUS) ×3 IMPLANT
SCISSORS LAP 5X45 EPIX DISP (ENDOMECHANICALS) ×3 IMPLANT
SEAL CANN UNIV 5-8 DVNC XI (MISCELLANEOUS) ×4 IMPLANT
SEAL XI 5MM-8MM UNIVERSAL (MISCELLANEOUS) ×8
SET IRRIG Y TYPE TUR BLADDER L (SET/KITS/TRAYS/PACK) IMPLANT
SOLUTION ELECTROLUBE (MISCELLANEOUS) ×3 IMPLANT
SPONGE GAUZE 2X2 STER 10/PKG (GAUZE/BANDAGES/DRESSINGS)
SPONGE LAP 4X18 X RAY DECT (DISPOSABLE) ×3 IMPLANT
SUT ETHILON 3 0 PS 1 (SUTURE) ×3 IMPLANT
SUT MNCRL AB 4-0 PS2 18 (SUTURE) ×6 IMPLANT
SUT V-LOC BARB 180 2/0GR6 GS22 (SUTURE) ×12
SUT VIC AB 0 CT1 27 (SUTURE) ×10
SUT VIC AB 0 CT1 27XBRD ANTBC (SUTURE) ×5 IMPLANT
SUT VIC AB 0 CT1 36 (SUTURE) ×6 IMPLANT
SUT VIC AB 2-0 SH 27 (SUTURE)
SUT VIC AB 2-0 SH 27X BRD (SUTURE) IMPLANT
SUT VIC AB 2-0 UR6 27 (SUTURE) ×6 IMPLANT
SUT VICRYL 0 UR6 27IN ABS (SUTURE) ×3 IMPLANT
SUT VLOC BARB 180 ABS3/0GR12 (SUTURE)
SUTURE V-LC BRB 180 2/0GR6GS22 (SUTURE) ×4 IMPLANT
SUTURE VLOC BRB 180 ABS3/0GR12 (SUTURE) IMPLANT
TOWEL OR 17X26 10 PK STRL BLUE (TOWEL DISPOSABLE) ×3 IMPLANT
TOWEL OR NON WOVEN STRL DISP B (DISPOSABLE) ×3 IMPLANT
WATER STERILE IRR 1000ML POUR (IV SOLUTION) ×3 IMPLANT

## 2017-04-14 NOTE — H&P (Signed)
Urology Admission H&P  Chief Complaint: Urinary retentuion  History of Present Illness: [name] is an 84yo with a hx of BPH and urinary retention. He has failed medical therapy and has a 200g prostate. He currently has an indwelling foley. He has urinary urgency. No fevers  Past Medical History:  Diagnosis Date  . Anemia   . Aortic atherosclerosis ([place]) 01/09/2017   Noted on CT  . Aortic regurgitation 10/2016   trivial: noted on ECHO  . Atrial fibrillation ([place])   . Dizziness   . Enlarged prostate 10/2016   noted on CT  . Gallstone 01/09/2017   2.2cm:  noted on CT  . History of blood transfusion 01/10/2017   2 units  . History of colonic polyps   . History of excision of testicular mass   . History of GI bleed    December 2018  . History of kidney stones   . Inguinal hernia    Left  . Liver cyst 01/09/2017   2 unchanged noted on CT  . Mild concentric left ventricular hypertrophy (LVH) 11/06/2016   noted on ECHO  . Mitral regurgitation 10/2016   trivial: noted on ECHO  . Nausea   . Obstructive uropathy   . Thumb amputation status, left    machine accident  . Tricuspid regurgitation 10/2016   trivial: noted on ECHO  . Weight loss    Past Surgical History:  Procedure Laterality Date  . COLONOSCOPY N/A 01/11/2017   Procedure: COLONOSCOPY;  Surgeon: Ronnette Juniper, MD;  [facility];  Service: Gastroenterology;  Laterality: N/A;  . CYSTOSCOPY WITH RETROGRADE PYELOGRAM, URETEROSCOPY AND STENT PLACEMENT Right 11/21/2016   Procedure: CYSTOSCOPY WITH RETROGRADE PYELOGRAM, URETEROSCOPY AND STENT PLACEMENT;  Surgeon: Cleon Gustin, MD;  [facility];  Service: Urology;  Laterality: Right;  . ESOPHAGOGASTRODUODENOSCOPY N/A 01/10/2017   Procedure: ESOPHAGOGASTRODUODENOSCOPY (EGD);  Surgeon: Laurence Spates, MD;  Location: [facility];  Service: Endoscopy;  Laterality: N/A;  . HERNIA REPAIR  1974  . HOLMIUM LASER APPLICATION Right 32/02/252   Procedure: HOLMIUM  LASER APPLICATION;  Surgeon: Cleon Gustin, MD;  [facility];  Service: Urology;  Laterality: Right;  . IRRIGATION AND DEBRIDEMENT ABSCESS Right 12/26/2016   Procedure: RIGHT SCROTAL EXPLORATION, RIGHT ORCHIECTOMY;  Surgeon: Lucas Mallow, MD;  [facility];  Service: Urology;  Laterality: Right;  . ORCHIECTOMY Right 12/26/2016   Procedure: POSSIBLE RIGHT ORCHIECTOMY;  Surgeon: Lucas Mallow, MD;  [facility];  Service: Urology;  Laterality: Right;  . TESTICLE SURGERY      Home Medications:  Current Facility-Administered Medications  Medication Dose Route Frequency Provider Last Rate Last Dose  . bupivacaine liposome (EXPAREL) 1.3 % injection 266 mg  20 mL Infiltration Once Cleon Gustin, MD      . ceFAZolin (ANCEF) IVPB 2g/100 mL premix  2 g Intravenous 30 min Pre-Op Alyson Ingles Candee Furbish, MD      . lactated ringers infusion   Intravenous Continuous Janeece Riggers, MD 50 mL/hr at 04/14/17 2706     Allergies:  Allergies  Allergen Reactions  . Flomax [Tamsulosin Hcl] Nausea Only    Family History  Problem Relation Age of Onset  . Dementia Mother   . Lung cancer Father        Smoker  . CAD Father        MI at age 62  . Heart attack Brother    Social History:  reports that he quit smoking about 46 years  ago. His smoking use included cigarettes. He has never used smokeless tobacco. He reports that he drinks alcohol. He reports that he does not use drugs.  Review of Systems  All other systems reviewed and are negative.   Physical Exam:  Vital signs in last 24 hours: Temp:  [98.2 F (36.8 C)] 98.2 F (36.8 C) (03/25 0534) Pulse Rate:  [91] 91 (03/25 0534) Resp:  [16] 16 (03/25 0534) BP: (165)/(92) 165/92 (03/25 0534) SpO2:  [100 %] 100 % (03/25 0534) Weight:  [90.7 kg (200 lb)] 90.7 kg (200 lb) (03/25 5035) Physical Exam  Constitutional: He is oriented to person, place, and time. He appears well-developed and well-nourished.  HENT:  Head:  Normocephalic and atraumatic.  Eyes: Pupils are equal, round, and reactive to light. EOM are normal.  Neck: Normal range of motion. No thyromegaly present.  Cardiovascular: Normal rate and regular rhythm.  Respiratory: Effort normal. No respiratory distress.  GI: Soft. He exhibits no distension.  Musculoskeletal: Normal range of motion. He exhibits no edema.  Neurological: He is alert and oriented to person, place, and time.  Skin: Skin is warm and dry.  Psychiatric: He has a normal mood and affect. His behavior is normal. Judgment and thought content normal.    Laboratory Data:  Results for orders placed or performed during the hospital encounter of 04/14/17 (from the past 24 hour(s))  Type and screen [place]     Status: None   Collection Time: 04/14/17  6:20 AM  Result Value Ref Range   ABO/RH(D) A NEG    Antibody Screen NEG    Sample Expiration      04/17/2017 Performed at [facility], [place] [address], [place], [place] [address]    No results found for this or any previous visit (from the past 240 hour(s)). Creatinine: No results for input(s): CREATININE in the last 168 hours. Baseline Creatinine: unknown  Impression/Assessment:  84yo with BPh with LUTS, urinary retention  Plan:  The risks/benefits/alterantives to robotic simple prostatectomy was explaiend to the patient and he understands and wishes to proceed with surgery  [name] 04/14/2017, 7:36 AM

## 2017-04-14 NOTE — Transfer of Care (Signed)
Immediate Anesthesia Transfer of Care Note  Patient: Johnny Patel  Procedure(s) Performed: XI ROBOTIC ASSISTED SIMPLE PROSTATECTOMY (N/A )  Patient Location: PACU  Anesthesia Type:General  Level of Consciousness: awake and patient cooperative  Airway & Oxygen Therapy: Patient Spontanous Breathing and Patient connected to face mask  Post-op Assessment: Report given to RN and Post -op Vital signs reviewed and stable  Post vital signs: Reviewed and stable  Last Vitals:  Vitals Value Taken Time  BP 131/90 04/14/2017 11:47 AM  Temp    Pulse 112 04/14/2017 11:50 AM  Resp 17 04/14/2017 11:50 AM  SpO2 93 % 04/14/2017 11:50 AM  Vitals shown include unvalidated device data.  Last Pain:  Vitals:   04/14/17 0534  TempSrc: Oral         Complications: No apparent anesthesia complications

## 2017-04-14 NOTE — Op Note (Signed)
PREOPERATIVE DIAGNOSIS: BPH with urinary retention  POSTOPERATIVE DIAGNOSIS: Same  PROCEDURES: 1. Robotic-assisted laparoscopic simple prostatectomy.  ANESTHESIA: General  ATTENDING: [name], MD  ASSISTANT: Clemetine Marker, PA  RESIDENT: none  ESTIMATED BLOOD LOSS: 800 mL.  COMPLICATIONS: None.  SPECIMEN: 1.prostatic adenoma  ANTIBIOTICS: ancef  FINDINGS: 3cm intravesical prostatic protrusion. Ureteral orifices in normal anatomic location. No leaks from cystotomy at 150cc of water. Long broke during the case and a piece of plastic came off the the bipolar. The piece of plastic was located and removed. The assistant was utilized for retraction, suction and passing sutures  DRAINS: 1. Jackson-Pratt drain to bulb suction. 2. Foley catheter to straight drain.  INDICATION: Johnny Patel is a very pleasant 84 year old gentleman, who has BPH with significant LUTS including urinary retention. His TRUS volume is 197cc.  Options were discussed with the patient in detail for primary manage including continued surveillance protocols versus surgical extirpation with and without minimally invasive assistance and he wished to proceed with robotic simple prostatectomy. Informed consent was obtained and placed in the medical record.  PROCEDURE IN DETAIL: The patient was brought to the operating room and a breif timeout was down to ensure correct patient, correct procedure, and correct site. Intravenous antibiotics were administered. General endotracheal anesthesia was introduced. The patient was placed into a low lithotomy position after tucking his arms with foam padding, placing on a pink and non-slide foam pad. A test of steep Trendelenburg positioning was performed and he was found to be suitably positioned. Sterile field was created by prepping and draping the patient's penis, perineum and proximal thighs using iodine and his infra-xiphoid abdomen using chlorhexidine  gluconate. Next, a high-flow, low-pressure pneumoperitoneum was obtained using Veress technique in the infraumbilical midline having passed the aspiration and drop test. Next, a 8-mm robotic camera port was placed in the same location. Laparoscopic examination of the peritoneal cavity revealed no significant adhesions and no visceral injury. Additional ports were placed as follows: Right paramedian 8-mm robotic port, right far lateral 12-mm assistant port, left paramedian 8-mm robotic port, left far lateral 8-mm robotic port, and right paramedian 5-mm suction port. Robot was docked and passed through the electronic checks. Next, attention was directed for the development of space of Retzius. Incision was made lateral to the left medial umbilical ligament from the midline towards the area of the internal ring and coursing along the iliac vessels towards the area of the ureter, which was positively identified. The left bladder was dissected away from the pelvic sidewall towards the area of the endopelvic fascia. A mirror-imaged dissection was performed on the right side.  Additional anterior attachments were taken down using cautery scissors. Next, the bladder neck was identified moving the Foley catheter back and forth. We then made a 6cm transverse cystotomy 3cm from the bladder neck. We identified the ureteral orifices and care was taken to exclude then from the dissection. We then placed 3 holding stitches in the anterior bladder wall and secured it to Coopers ligament.  We then made a circumscribing incision around the base of the prostate. We then used a 0 vicryl in a figure of eight fashion in the base of the adenoma for traction. We proceeded with a posterior dissection of the prostate until we identified the capsule. We then used a combination of electrocautery, blunt and sharp dissection to free the adenoma from the capsule. We then dissected laterally to the apex. Individual  bleeders were cauterized. We then dissected anteriorly along the capsule until  we reached the apex. The adenoma was then freed and placed in an endocatch bag. We noted good hemostasis and no additional sutures were placed. A Foley catheter was then placed per urethra easily. We then tacked down the bladder neck to the prostatic fossa with a single interrupted 2-0 vicryl. We then proceeded to closed the cystotomy. We closed the bladder with a running 0 vicryl full thickness. We then performed a imbricating second layer  Closure with 0 vicryl. The bladder was then filled with 150cc of water and we noted no leak.  All sponge and needle counts were correct. A closed suction drain was brought to the previous left lateral robotic port site into the area of the peritoneal cavity. The previous right 12-mm assistant port was closed at the level of the fascia using a Carter-Thomason suture passer under laparoscopic vision. Robot was undocked. Specimen was retrieved by extending the previous camera port site inferiorly for distance approximately 3 cm and removing the prostatectomy specimens and setting aside for permanent pathology. The site was closed at the level of fascia using figure-of-eight 0 vicryl followed by reapproximation of the Scarpa's using running Vicryl. All incision sites were infiltrated with dilute Lyophilized Marcaine and closed at the level of the skin using subcuticular Monocryl followed by Dermabond. Procedure was then terminated. The patient tolerated the procedure well. There were no immediate periprocedural complications and the patient was taken to the postanesthesia care unit in stable Condition.  COMPLICATIONS: None  CONDITION: Stable, extubated, transferred to PACU  PLAN: The patient will be admitted for 1-2 for hydration, post operative monitoring and pain control. He will be discharged home with foley in place and foley will be removed in 14 days, He will have a  cystogram prior to foley catheter removal

## 2017-04-14 NOTE — Discharge Instructions (Signed)

## 2017-04-14 NOTE — Anesthesia Procedure Notes (Signed)
Procedure Name: Intubation Date/Time: 04/14/2017 7:51 AM Performed by: Claudia Desanctis, CRNA Pre-anesthesia Checklist: Patient identified, Emergency Drugs available, Suction available and Patient being monitored Patient Re-evaluated:Patient Re-evaluated prior to induction Oxygen Delivery Method: Circle system utilized Preoxygenation: Pre-oxygenation with 100% oxygen Induction Type: IV induction Ventilation: Mask ventilation without difficulty Laryngoscope Size: 2 and Miller Grade View: Grade I Tube type: Oral Tube size: 7.5 mm Number of attempts: 1 Airway Equipment and Method: Stylet Placement Confirmation: ETT inserted through vocal cords under direct vision,  positive ETCO2 and breath sounds checked- equal and bilateral Secured at: 23 cm Tube secured with: Tape Dental Injury: Teeth and Oropharynx as per pre-operative assessment

## 2017-04-14 NOTE — Anesthesia Postprocedure Evaluation (Signed)
Anesthesia Post Note  Patient: Johnny Patel  Procedure(s) Performed: XI ROBOTIC ASSISTED SIMPLE PROSTATECTOMY (N/A )     Patient location during evaluation: PACU Anesthesia Type: General Level of consciousness: awake and alert Pain management: pain level controlled Vital Signs Assessment: post-procedure vital signs reviewed and stable Respiratory status: spontaneous breathing, nonlabored ventilation, respiratory function stable and patient connected to nasal cannula oxygen Cardiovascular status: blood pressure returned to baseline and stable Postop Assessment: no apparent nausea or vomiting Anesthetic complications: no    Last Vitals:  Vitals:   04/14/17 1300 04/14/17 1329  BP: (!) 130/93 (!) 151/98  Pulse: 86 92  Resp: 14 16  Temp:  (!) 36.3 C  SpO2: 96% 99%    Last Pain:  Vitals:   04/14/17 1345  TempSrc:   PainSc: 0-No pain                 Gaylen Venning

## 2017-04-14 NOTE — Progress Notes (Signed)
Called Dr. Alyson Ingles regarding patient continuing to remain in atrial fibrillation with HR between 103 and 110.  Order noted to given Lopressor 5 mg IV.  Dr Ambrose Pancoast called and also made him aware.  Patient does not take atrial fibrillation medications at home just [place].  Patient's urine continues to be bloody as understandable.  Dr. Alyson Ingles is going to call and consult medicine to cover his atrial fibrillation.  Will place patient on telemetry on floor.  Heparin will be given as ordered and not held per Dr. Alyson Ingles.

## 2017-04-15 ENCOUNTER — Encounter (HOSPITAL_COMMUNITY): Payer: Self-pay | Admitting: Urology

## 2017-04-15 LAB — BASIC METABOLIC PANEL
Anion gap: 6 (ref 5–15)
BUN: 17 mg/dL (ref 6–20)
CALCIUM: 8.3 mg/dL — AB (ref 8.9–10.3)
CHLORIDE: 105 mmol/L (ref 101–111)
CO2: 26 mmol/L (ref 22–32)
CREATININE: 1.16 mg/dL (ref 0.61–1.24)
GFR calc non Af Amer: 56 mL/min — ABNORMAL LOW (ref >60)
GLUCOSE: 145 mg/dL — AB (ref 65–99)
Potassium: 4.6 mmol/L (ref 3.5–5.1)
Sodium: 137 mmol/L (ref 135–145)

## 2017-04-15 LAB — HEMOGLOBIN AND HEMATOCRIT, BLOOD
HEMATOCRIT: 31.7 % — AB (ref 39.0–52.0)
Hemoglobin: 10.5 g/dL — ABNORMAL LOW (ref 13.0–17.0)

## 2017-04-15 NOTE — Progress Notes (Signed)
1 Day Post-Op Subjective: Patient reports mild suprapubic pain. JP drain 220cc. Urine light pink on slow drip CBI and traction. Tolerating clear liquids  Objective: Vital signs in last 24 hours: Temp:  [97.4 F (36.3 C)-98.2 F (36.8 C)] 98.2 F (36.8 C) (03/26 0501) Pulse Rate:  [83-114] 92 (03/26 0501) Resp:  [0-18] 16 (03/26 0501) BP: (103-151)/(65-98) 134/76 (03/26 0501) SpO2:  [94 %-100 %] 96 % (03/26 0501)  Intake/Output from previous day: 03/25 0701 - 03/26 0700 In: 22763.3 [P.O.:600; I.V.:4213.3; IV Piggyback:350] Out: 23320 [Urine:22300; Drains:220; Blood:800] Intake/Output this shift: No intake/output data recorded.  Physical Exam:  General:alert, cooperative and appears stated age GI: soft and tenderness: suprapubic Male genitalia: Penis: normal, no lesions Extremities: extremities normal, atraumatic, no cyanosis or edema  Lab Results: Recent Labs    04/14/17 1214 04/14/17 1432 04/15/17 0450  HGB 12.8* 11.7* 10.5*  HCT 39.1 36.5* 31.7*   BMET Recent Labs    04/14/17 1432 04/15/17 0450  NA  --  137  K  --  4.6  CL  --  105  CO2  --  26  GLUCOSE  --  145*  BUN  --  17  CREATININE 1.12 1.16  CALCIUM  --  8.3*   No results for input(s): LABPT, INR in the last 72 hours. No results for input(s): LABURIN in the last 72 hours. Results for orders placed or performed during the hospital encounter of 01/09/17  MRSA PCR Screening     Status: None   Collection Time: 01/09/17 11:16 PM  Result Value Ref Range Status   MRSA by PCR NEGATIVE NEGATIVE Final    Comment:        The GeneXpert MRSA Assay (FDA approved for NASAL specimens only), is one component of a comprehensive MRSA colonization surveillance program. It is not intended to diagnose MRSA infection nor to guide or monitor treatment for MRSA infections.     Studies/Results: No results found.  Assessment/Plan: POD#1 robotic simple prostatectomy  1. Continue clear liquid diet 2. Wean CBi  to off 3. Foley off traction 4. Ambulate in halls with assistance 5. Likely discharge 48 hours   LOS: 1 day   [name] 04/15/2017, 8:07 AM

## 2017-04-16 LAB — CBC
HCT: 28.8 % — ABNORMAL LOW (ref 39.0–52.0)
HEMOGLOBIN: 9.5 g/dL — AB (ref 13.0–17.0)
MCH: 30.9 pg (ref 26.0–34.0)
MCHC: 33 g/dL (ref 30.0–36.0)
MCV: 93.8 fL (ref 78.0–100.0)
Platelets: 183 10*3/uL (ref 150–400)
RBC: 3.07 MIL/uL — ABNORMAL LOW (ref 4.22–5.81)
RDW: 13.4 % (ref 11.5–15.5)
WBC: 10.1 10*3/uL (ref 4.0–10.5)

## 2017-04-16 NOTE — Progress Notes (Signed)
Patient called RN to room c/o pain and urine coming out around catheter. Bed pad and gown wet with urine. Foley irrigate and many clots returned. CBI restarted and MD made aware. Will continue CBI until tomorrow morning and reevaluate.

## 2017-04-17 ENCOUNTER — Inpatient Hospital Stay (HOSPITAL_COMMUNITY): Payer: Medicare Other

## 2017-04-17 LAB — CBC WITH DIFFERENTIAL/PLATELET
Basophils Absolute: 0 10*3/uL (ref 0.0–0.1)
Basophils Relative: 0 %
EOS ABS: 0 10*3/uL (ref 0.0–0.7)
Eosinophils Relative: 0 %
HEMATOCRIT: 29.6 % — AB (ref 39.0–52.0)
HEMOGLOBIN: 9.7 g/dL — AB (ref 13.0–17.0)
LYMPHS ABS: 0.8 10*3/uL (ref 0.7–4.0)
Lymphocytes Relative: 6 %
MCH: 30.7 pg (ref 26.0–34.0)
MCHC: 32.8 g/dL (ref 30.0–36.0)
MCV: 93.7 fL (ref 78.0–100.0)
MONOS PCT: 9 %
Monocytes Absolute: 1.4 10*3/uL — ABNORMAL HIGH (ref 0.1–1.0)
NEUTROS ABS: 13 10*3/uL — AB (ref 1.7–7.7)
NEUTROS PCT: 85 %
Platelets: 199 10*3/uL (ref 150–400)
RBC: 3.16 MIL/uL — ABNORMAL LOW (ref 4.22–5.81)
RDW: 13.4 % (ref 11.5–15.5)
WBC: 15.2 10*3/uL — ABNORMAL HIGH (ref 4.0–10.5)

## 2017-04-17 LAB — COMPREHENSIVE METABOLIC PANEL
ALK PHOS: 38 U/L (ref 38–126)
ALT: 8 U/L — ABNORMAL LOW (ref 17–63)
ANION GAP: 9 (ref 5–15)
AST: 15 U/L (ref 15–41)
Albumin: 2.8 g/dL — ABNORMAL LOW (ref 3.5–5.0)
BILIRUBIN TOTAL: 0.5 mg/dL (ref 0.3–1.2)
BUN: 12 mg/dL (ref 6–20)
CALCIUM: 7.9 mg/dL — AB (ref 8.9–10.3)
CO2: 24 mmol/L (ref 22–32)
Chloride: 103 mmol/L (ref 101–111)
Creatinine, Ser: 0.99 mg/dL (ref 0.61–1.24)
GFR calc non Af Amer: >60 mL/min (ref >60)
Glucose, Bld: 128 mg/dL — ABNORMAL HIGH (ref 65–99)
Potassium: 3.4 mmol/L — ABNORMAL LOW (ref 3.5–5.1)
Sodium: 136 mmol/L (ref 135–145)
TOTAL PROTEIN: 5.5 g/dL — AB (ref 6.5–8.1)

## 2017-04-17 LAB — PROTIME-INR
INR: 1.14
Prothrombin Time: 14.6 seconds (ref 11.4–15.2)

## 2017-04-17 LAB — LACTIC ACID, PLASMA
LACTIC ACID, VENOUS: 1.3 mmol/L (ref 0.5–1.9)
Lactic Acid, Venous: 1.1 mmol/L (ref 0.5–1.9)

## 2017-04-17 LAB — APTT: APTT: 32 s (ref 24–36)

## 2017-04-17 LAB — PROCALCITONIN: PROCALCITONIN: 0.33 ng/mL

## 2017-04-17 MED ORDER — PIPERACILLIN-TAZOBACTAM 3.375 G IVPB
3.3750 g | Freq: Three times a day (TID) | INTRAVENOUS | Status: DC
  Administered 2017-04-17 – 2017-04-19 (×6): 3.375 g via INTRAVENOUS
  Filled 2017-04-17 (×6): qty 50

## 2017-04-17 MED ORDER — PIPERACILLIN-TAZOBACTAM 3.375 G IVPB 30 MIN
3.3750 g | Freq: Once | INTRAVENOUS | Status: AC
  Administered 2017-04-17: 3.375 g via INTRAVENOUS
  Filled 2017-04-17 (×2): qty 50

## 2017-04-17 MED ORDER — VANCOMYCIN HCL 10 G IV SOLR
1500.0000 mg | INTRAVENOUS | Status: DC
  Administered 2017-04-17 – 2017-04-18 (×2): 1500 mg via INTRAVENOUS
  Filled 2017-04-17 (×3): qty 1500

## 2017-04-17 NOTE — Progress Notes (Signed)
POD#3 Subjective: Febrile overnight and sepsis protocol initiated this morning. Patient denies any pain. Urine clear off CBI  Objective: Vital signs in last 24 hours: Temp:  [98.5 F (36.9 C)-102.9 F (39.4 C)] 98.5 F (36.9 C) (03/28 1238) Pulse Rate:  [108-120] 119 (03/28 1238) Resp:  [18-20] 18 (03/28 1238) BP: (90-120)/(50-68) 106/60 (03/28 1238) SpO2:  [96 %-100 %] 100 % (03/28 1238)  Intake/Output from previous day: 03/27 0701 - 03/28 0700 In: 7160.8 [P.O.:120; I.V.:1340.8] Out: 9323 [FTDDU:2025; Drains:60] Intake/Output this shift: Total I/O In: -  Out: 1900 [Urine:1900]  Physical Exam:  General:alert, cooperative and appears stated age GI: soft and tenderness: suprapubic Male genitalia: Penis: normal, no lesions Extremities: extremities normal, atraumatic, no cyanosis or edema  Lab Results: Recent Labs    04/15/17 0450 04/16/17 0414 04/17/17 0759  HGB 10.5* 9.5* 9.7*  HCT 31.7* 28.8* 29.6*   BMET Recent Labs    04/15/17 0450 04/17/17 0759  NA 137 136  K 4.6 3.4*  CL 105 103  CO2 26 24  GLUCOSE 145* 128*  BUN 17 12  CREATININE 1.16 0.99  CALCIUM 8.3* 7.9*   Recent Labs    04/17/17 0759  INR 1.14   No results for input(s): LABURIN in the last 72 hours. Results for orders placed or performed during the hospital encounter of 01/09/17  MRSA PCR Screening     Status: None   Collection Time: 01/09/17 11:16 PM  Result Value Ref Range Status   MRSA by PCR NEGATIVE NEGATIVE Final    Comment:        The GeneXpert MRSA Assay (FDA approved for NASAL specimens only), is one component of a comprehensive MRSA colonization surveillance program. It is not intended to diagnose MRSA infection nor to guide or monitor treatment for MRSA infections.     Studies/Results: Dg Chest Port 1 View  Result Date: 04/17/2017 CLINICAL DATA:  New onset fever. EXAM: PORTABLE CHEST 1 VIEW COMPARISON:  CT 01/09/2017.  Chest x-ray 01/09/2017. FINDINGS: Mediastinum and  hilar structures normal. Stable cardiomegaly. Mild left base subsegmental atelectasis. 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 No pleural effusion or pneumothorax degenerative changes thoracic spine. IMPRESSION: 1.  Stable cardiomegaly. 2.  Mild left base subsegmental atelectasis. Electronically Signed   By: Marcello Moores  Register   On: 04/17/2017 09:08    Assessment/Plan: POD#3 robotic simple prostatectomy  1. Continue regular diet 2.  CBi to off 3. Foley to remain off traction 4. Await cultures 5. Continue broad spectrum antibiotics    LOS: 3 days   [name] 04/17/2017, 9:43 PM

## 2017-04-17 NOTE — Care Management Important Message (Signed)
Important Message  Patient Details  Name: Johnny Patel MRN: [id number] Date of Birth: 03/09/1931   Medicare Important Message Given:  Yes    Kerin Salen 04/17/2017, 11:35 AMImportant Message  Patient Details  Name: Johnny Patel MRN: [id number] Date of Birth: 1931/06/13   Medicare Important Message Given:  Yes    Kerin Salen 04/17/2017, 11:35 AM

## 2017-04-17 NOTE — Plan of Care (Signed)
No fever spikes during 7 a to 7 p shift, patient medicated for pain x 2 with improvement.  Up to bathroom several times during shift, urine remains blood tinged, hand irrigated x 1, flushed easily, no clots noted.  Family at bedside for much of shift.

## 2017-04-17 NOTE — Progress Notes (Signed)
POD#2 Subjective: Patient reports mild suprapubic pain. JP drain 10cc. Urine clear on slow drip CBI and traction. Tolerating regular diet  Objective: Vital signs in last 24 hours: Temp:  [98.1 F (36.7 C)-102.9 F (39.4 C)] 102.5 F (39.2 C) (03/28 0729) Pulse Rate:  [93-113] 108 (03/28 0544) Resp:  [18] 18 (03/28 0544) BP: (118-133)/(68-77) 120/68 (03/28 0544) SpO2:  [95 %-97 %] 96 % (03/28 0544)  Intake/Output from previous day: 03/27 0701 - 03/28 0700 In: 7160.8 [P.O.:120; I.V.:1340.8] Out: 0321 [YYQMG:5003; Drains:60] Intake/Output this shift: No intake/output data recorded.  Physical Exam:  General:alert, cooperative and appears stated age GI: soft and tenderness: suprapubic Male genitalia: Penis: normal, no lesions Extremities: extremities normal, atraumatic, no cyanosis or edema  Lab Results: Recent Labs    04/14/17 1432 04/15/17 0450 04/16/17 0414  HGB 11.7* 10.5* 9.5*  HCT 36.5* 31.7* 28.8*   BMET Recent Labs    04/14/17 1432 04/15/17 0450  NA  --  137  K  --  4.6  CL  --  105  CO2  --  26  GLUCOSE  --  145*  BUN  --  17  CREATININE 1.12 1.16  CALCIUM  --  8.3*   No results for input(s): LABPT, INR in the last 72 hours. No results for input(s): LABURIN in the last 72 hours. Results for orders placed or performed during the hospital encounter of 01/09/17  MRSA PCR Screening     Status: None   Collection Time: 01/09/17 11:16 PM  Result Value Ref Range Status   MRSA by PCR NEGATIVE NEGATIVE Final    Comment:        The GeneXpert MRSA Assay (FDA approved for NASAL specimens only), is one component of a comprehensive MRSA colonization surveillance program. It is not intended to diagnose MRSA infection nor to guide or monitor treatment for MRSA infections.     Studies/Results: No results found.  Assessment/Plan: POD#1 robotic simple prostatectomy  1. Continue regular diet 2.  CBi to off 3. Foley to remain off traction 4. Ambulate in  halls with assistance 5. Likely discharge 48 hours    LOS: 3 days   [name] 04/17/2017, 7:36 AM

## 2017-04-17 NOTE — Progress Notes (Signed)
Pharmacy Antibiotic Note  Johnny Patel is a 84 y.o. male admitted on 04/14/2017 for robotic prostatectomy performed the same day. Patient spiked several moderate fevers overnight and pharmacy was consulted for vancomycin and Zosyn dosing.  Today, 04/17/2017:  Last fever 0630 today  LA/PCT unremarkable  SCr wnl  WBC elevated though likely d/t procedure  Plan:  Vancomycin 1500 mg IV q24 hr (est AUC 467 based on SCr 1)  Measure vancomycin AUC at steady state as indicated  Zosyn 3.375 g IV given once over 30 minutes, then every 8 hrs by 4-hr infusion  Height: (n/a) Weight: (n/a) IBW/kg (Calculated) : 76.45  Temp (24hrs), Avg:100.4 F (38 C), Min:98.1 F (36.7 C), Max:102.9 F (39.4 C)  Recent Labs  Lab 04/14/17 1214 04/14/17 1432 04/15/17 0450 04/16/17 0414 04/17/17 0759 04/17/17 1050  WBC 22.0* 15.7*  --  10.1 15.2*  --   CREATININE  --  1.12 1.16  --  0.99  --   LATICACIDVEN  --   --   --   --  1.1 1.3    Estimated Creatinine Clearance: 61.2 mL/min (by C-G formula based on SCr of 0.99 mg/dL).    Allergies  Allergen Reactions  . Flomax [Tamsulosin Hcl] Nausea Only      Thank you for allowing pharmacy to be a part of this patient's care.  Reuel Boom, PharmD, BCPS [phone] 04/17/2017, 1:10 PM

## 2017-04-18 LAB — CREATININE, SERUM
Creatinine, Ser: 1.23 mg/dL (ref 0.61–1.24)
GFR calc Af Amer: >60 mL/min (ref >60)
GFR calc non Af Amer: 52 mL/min — ABNORMAL LOW (ref >60)

## 2017-04-19 LAB — CREATININE, SERUM
CREATININE: 1.07 mg/dL (ref 0.61–1.24)
GFR calc Af Amer: >60 mL/min (ref >60)
GFR calc non Af Amer: >60 mL/min (ref >60)

## 2017-04-19 LAB — C DIFFICILE QUICK SCREEN W PCR REFLEX
C DIFFICILE (CDIFF) INTERP: NOT DETECTED
C DIFFICILE (CDIFF) TOXIN: NEGATIVE
C Diff antigen: NEGATIVE

## 2017-04-19 MED ORDER — LOPERAMIDE HCL 2 MG PO CAPS
2.0000 mg | ORAL_CAPSULE | ORAL | Status: DC | PRN
  Administered 2017-04-19: 2 mg via ORAL
  Filled 2017-04-19: qty 1

## 2017-04-19 NOTE — Progress Notes (Signed)
Reviewed discharge information with patient. Answered all questions. Patient able to teach back medications and reasons to contact MD/911. Patient verbalizes importance of urology follow up appointment. Patient displays ability to care for catheter and change to leg bag.  Barbee Shropshire. Brigitte Pulse, RN

## 2017-04-19 NOTE — Progress Notes (Addendum)
POD#4 Subjective:  Afebrile, vital signs within normal limits. Complains of loose stools. Patient denies any pain. Urine clear off CBI  Objective: Vital signs in last 24 hours: Temp:  [98 F (36.7 C)-98.7 F (37.1 C)] 98.7 F (37.1 C) (03/30 0439) Pulse Rate:  [87-101] 87 (03/30 0439) Resp:  [20] 20 (03/30 0439) BP: (100-112)/(67-80) 100/67 (03/30 0439) SpO2:  [97 %-98 %] 97 % (03/30 0439)  Intake/Output from previous day: 03/29 0701 - 03/30 0700 In: 1295 [P.O.:240; I.V.:405; IV Piggyback:650] Out: 5366 [YQIHK:7425; Drains:20] Intake/Output this shift: No intake/output data recorded.  Physical Exam:  General:alert, cooperative and appears stated age GI: soft and tenderness: suprapubic. Incisions c/d/i.  Male genitalia: Penis: normal, no lesions. Foley in place draining clear cherry urine off CBI.  Extremities: extremities normal, atraumatic, no cyanosis or edema  Lab Results: Recent Labs    04/17/17 0759  HGB 9.7*  HCT 29.6*   BMET Recent Labs    04/17/17 0759 04/18/17 0342 04/19/17 0424  NA 136  --   --   K 3.4*  --   --   CL 103  --   --   CO2 24  --   --   GLUCOSE 128*  --   --   BUN 12  --   --   CREATININE 0.99 1.23 1.07  CALCIUM 7.9*  --   --    Recent Labs    04/17/17 0759  INR 1.14   No results for input(s): LABURIN in the last 72 hours. Results for orders placed or performed during the hospital encounter of 04/14/17  Culture, blood (x 2)     Status: None (Preliminary result)   Collection Time: 04/17/17  7:59 AM  Result Value Ref Range Status   Specimen Description   Final    BLOOD RIGHT ARM Performed at [facility] [address], [place], [place] [address]    Special Requests   Final    BOTTLES DRAWN AEROBIC AND ANAEROBIC Blood Culture adequate volume Performed at [facility] [address] [place], [place] [address]    Culture   Final    NO GROWTH 1 DAY Performed at [facility],  [place] [address], [place], [place] [address]    Report Status PENDING  Incomplete  Culture, blood (x 2)     Status: None (Preliminary result)   Collection Time: 04/17/17  8:08 AM  Result Value Ref Range Status   Specimen Description   Final    BLOOD RIGHT HAND Performed at [facility] [address], [place], [place] [address]    Special Requests   Final    BOTTLES DRAWN AEROBIC AND ANAEROBIC Blood Culture adequate volume Performed at [facility] [address], [place], [place] [address]    Culture   Final    NO GROWTH 1 DAY Performed at [facility], [place] [address], [place],  [address]    Report Status PENDING  Incomplete    Studies/Results: Dg Chest Port 1 View  Result Date: 04/17/2017 CLINICAL DATA:  New onset fever. EXAM: PORTABLE CHEST 1 VIEW COMPARISON:  CT 01/09/2017.  Chest x-ray 01/09/2017. FINDINGS: Mediastinum and hilar structures normal. Stable cardiomegaly. Mild left base subsegmental atelectasis. 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 No pleural effusion or pneumothorax degenerative changes thoracic spine. IMPRESSION: 1.  Stable cardiomegaly. 2.  Mild left base subsegmental atelectasis. Electronically Signed   By: Marcello Moores  Register  On: 04/17/2017 09:08    Assessment/Plan: POD# robotic simple prostatectomy  1. Continue regular diet 2. CBI off 3. Foley to remain off traction 4. Await cultures 5. Continue broad spectrum antibiotics  6. Imodium for loose stools.    LOS: 5 days   Stasia Cavalier 04/19/2017, 7:52 AM  Patient was seen, examined,treatment plan was discussed with the resident.  I have directly reviewed the clinical findings, lab, imaging studies and management of this patient in detail. I have made the necessary changes and/or additions to the above noted documentation, and agree with the documentation, as recorded by the resident.

## 2017-04-20 NOTE — Discharge Summary (Addendum)
[facility] Discharge Summary  Admit date: 04/14/2017  Discharge date and time: 04/20/17   Discharge to: Home  Discharge Service: Urology  Discharge Attending Physician:  Dr. Karsten Ro  Discharge  Diagnoses: BPH  Secondary Diagnosis: Active Problems:   BPH with obstruction/lower urinary tract symptoms   OR Procedures: Procedure(s): XI ROBOTIC ASSISTED SIMPLE PROSTATECTOMY 04/14/2017   Ancillary Procedures: None   Discharge Day Services: The patient was seen and examined by the Urology team both in the morning and immediately prior to discharge.  Vital signs and laboratory values were stable and within normal limits.  The physical exam was benign and unchanged and all surgical wounds were examined.  Discharge instructions were explained and all questions answered.  Subjective  No acute events overnight. Pain Controlled. No fever or chills.  Objective No data found. Total I/O In: 8338 [P.O.:3220; I.V.:405; Other:500; IV Piggyback:700] Out: 2295 [SNKNL:9767; Drains:20; Stool:600]  General Appearance:        No acute distress Lungs:                       Normal work of breathing on room air Heart:                                Regular rate and rhythm Abdomen:                         Soft, non-tender, non-distended. Foley in place draining clear pink urine off CBI.  Extremities:                      Warm and well perfused   Hospital Course:  The patient underwent robotic simple prostatectomy on 04/14/2017.  The patient tolerated the procedure well, was extubated in the OR, and afterwards was taken to the PACU for routine post-surgical care. When stable the patient was transferred to the floor.   The patient did well postoperatively overall, though had a fever to 102F POD2. He was placed on IV broad spectrum antibiotics, and his fever resolved with negative blood and urine cultures.  The patient's diet was slowly advanced and at the time of discharge was tolerating a regular  diet.  The patient was discharged home 5 Days Post-Op, and was transitioned to Bactrim at discharge given negative cultures. At the time of discharge, he was tolerating a regular solid diet, was able to void spontaneously, have adequate pain control with P.O. pain medication, and could ambulate without difficulty. His JP was removed prior to d/c.The patient will follow up with [place] for post op check.   Condition at Discharge: Improved  Discharge Medications:  Allergies as of 04/19/2017      Reactions   Flomax [tamsulosin Hcl] Nausea Only      Medication List    TAKE these medications   HYDROcodone-acetaminophen 5-325 MG tablet Commonly known as:  NORCO Take 1-2 tablets by mouth every 6 (six) hours as needed for moderate pain or severe pain.   rivaroxaban 20 MG Tabs tablet Commonly known as:  XARELTO Take 1 tablet (20 mg total) by mouth daily with supper.   sulfamethoxazole-trimethoprim 800-160 MG tablet Commonly known as:  BACTRIM DS,SEPTRA DS Take 1 tablet by mouth 2 (two) times daily. Start the day prior to foley removal appointment       Patient was seen, examined,treatment plan was discussed with the resident.  I  have directly reviewed the clinical findings, lab, imaging studies and management of this patient in detail. I have made the necessary changes and/or additions to the above noted documentation, and agree with the documentation, as recorded by the resident.

## 2017-04-22 LAB — CULTURE, BLOOD (ROUTINE X 2)
CULTURE: NO GROWTH
Culture: NO GROWTH
SPECIAL REQUESTS: ADEQUATE
Special Requests: ADEQUATE

## 2017-04-28 DIAGNOSIS — R339 Retention of urine, unspecified: Secondary | ICD-10-CM | POA: Diagnosis not present

## 2017-05-28 DIAGNOSIS — N401 Enlarged prostate with lower urinary tract symptoms: Secondary | ICD-10-CM | POA: Diagnosis not present

## 2017-05-28 DIAGNOSIS — R339 Retention of urine, unspecified: Secondary | ICD-10-CM | POA: Diagnosis not present

## 2017-07-04 DIAGNOSIS — R609 Edema, unspecified: Secondary | ICD-10-CM | POA: Diagnosis not present

## 2017-07-04 DIAGNOSIS — Z79899 Other long term (current) drug therapy: Secondary | ICD-10-CM | POA: Diagnosis not present

## 2017-07-04 DIAGNOSIS — I482 Chronic atrial fibrillation: Secondary | ICD-10-CM | POA: Diagnosis not present

## 2017-07-04 DIAGNOSIS — N401 Enlarged prostate with lower urinary tract symptoms: Secondary | ICD-10-CM | POA: Diagnosis not present

## 2017-07-04 DIAGNOSIS — D649 Anemia, unspecified: Secondary | ICD-10-CM | POA: Diagnosis not present

## 2017-07-28 DIAGNOSIS — D649 Anemia, unspecified: Secondary | ICD-10-CM | POA: Diagnosis not present

## 2017-08-01 DIAGNOSIS — D509 Iron deficiency anemia, unspecified: Secondary | ICD-10-CM | POA: Diagnosis not present

## 2017-08-01 DIAGNOSIS — I482 Chronic atrial fibrillation: Secondary | ICD-10-CM | POA: Diagnosis not present

## 2017-09-01 DIAGNOSIS — R310 Gross hematuria: Secondary | ICD-10-CM | POA: Diagnosis not present

## 2017-09-01 DIAGNOSIS — R339 Retention of urine, unspecified: Secondary | ICD-10-CM | POA: Diagnosis not present

## 2017-10-17 DIAGNOSIS — D508 Other iron deficiency anemias: Secondary | ICD-10-CM | POA: Diagnosis not present

## 2017-10-24 DIAGNOSIS — I482 Chronic atrial fibrillation, unspecified: Secondary | ICD-10-CM | POA: Diagnosis not present

## 2017-10-24 DIAGNOSIS — D509 Iron deficiency anemia, unspecified: Secondary | ICD-10-CM | POA: Diagnosis not present

## 2017-10-24 DIAGNOSIS — Z23 Encounter for immunization: Secondary | ICD-10-CM | POA: Diagnosis not present

## 2017-12-05 DIAGNOSIS — R310 Gross hematuria: Secondary | ICD-10-CM | POA: Diagnosis not present

## 2017-12-05 DIAGNOSIS — R339 Retention of urine, unspecified: Secondary | ICD-10-CM | POA: Diagnosis not present

## 2018-01-23 DIAGNOSIS — I482 Chronic atrial fibrillation, unspecified: Secondary | ICD-10-CM | POA: Diagnosis not present

## 2018-01-23 DIAGNOSIS — D508 Other iron deficiency anemias: Secondary | ICD-10-CM | POA: Diagnosis not present

## 2018-01-30 DIAGNOSIS — Z23 Encounter for immunization: Secondary | ICD-10-CM | POA: Diagnosis not present

## 2018-01-30 DIAGNOSIS — I482 Chronic atrial fibrillation, unspecified: Secondary | ICD-10-CM | POA: Diagnosis not present

## 2018-01-30 DIAGNOSIS — D509 Iron deficiency anemia, unspecified: Secondary | ICD-10-CM | POA: Diagnosis not present

## 2018-04-02 DIAGNOSIS — H53021 Refractive amblyopia, right eye: Secondary | ICD-10-CM | POA: Diagnosis not present

## 2018-04-02 DIAGNOSIS — H25813 Combined forms of age-related cataract, bilateral: Secondary | ICD-10-CM | POA: Diagnosis not present

## 2018-04-24 DIAGNOSIS — D508 Other iron deficiency anemias: Secondary | ICD-10-CM | POA: Diagnosis not present

## 2018-05-01 DIAGNOSIS — I482 Chronic atrial fibrillation, unspecified: Secondary | ICD-10-CM | POA: Diagnosis not present

## 2018-05-01 DIAGNOSIS — D509 Iron deficiency anemia, unspecified: Secondary | ICD-10-CM | POA: Diagnosis not present

## 2018-05-28 DIAGNOSIS — R310 Gross hematuria: Secondary | ICD-10-CM | POA: Diagnosis not present

## 2018-05-28 DIAGNOSIS — R339 Retention of urine, unspecified: Secondary | ICD-10-CM | POA: Diagnosis not present

## 2018-09-29 DIAGNOSIS — Z79899 Other long term (current) drug therapy: Secondary | ICD-10-CM | POA: Diagnosis not present

## 2018-09-29 DIAGNOSIS — D508 Other iron deficiency anemias: Secondary | ICD-10-CM | POA: Diagnosis not present

## 2018-09-29 DIAGNOSIS — I482 Chronic atrial fibrillation, unspecified: Secondary | ICD-10-CM | POA: Diagnosis not present

## 2018-10-05 DIAGNOSIS — Z23 Encounter for immunization: Secondary | ICD-10-CM | POA: Diagnosis not present

## 2018-10-05 DIAGNOSIS — D509 Iron deficiency anemia, unspecified: Secondary | ICD-10-CM | POA: Diagnosis not present

## 2018-10-05 DIAGNOSIS — I482 Chronic atrial fibrillation, unspecified: Secondary | ICD-10-CM | POA: Diagnosis not present

## 2018-10-09 IMAGING — CR DG CHEST 2V
2 series · 2 of 2 positions shown · non-contrast
Comparison: None ; correlation CT abdomen and pelvis 11/05/2016

CLINICAL DATA: Tachycardia, elevated lactate, weight loss

EXAM:
CHEST  2 VIEW

[w chest lat]
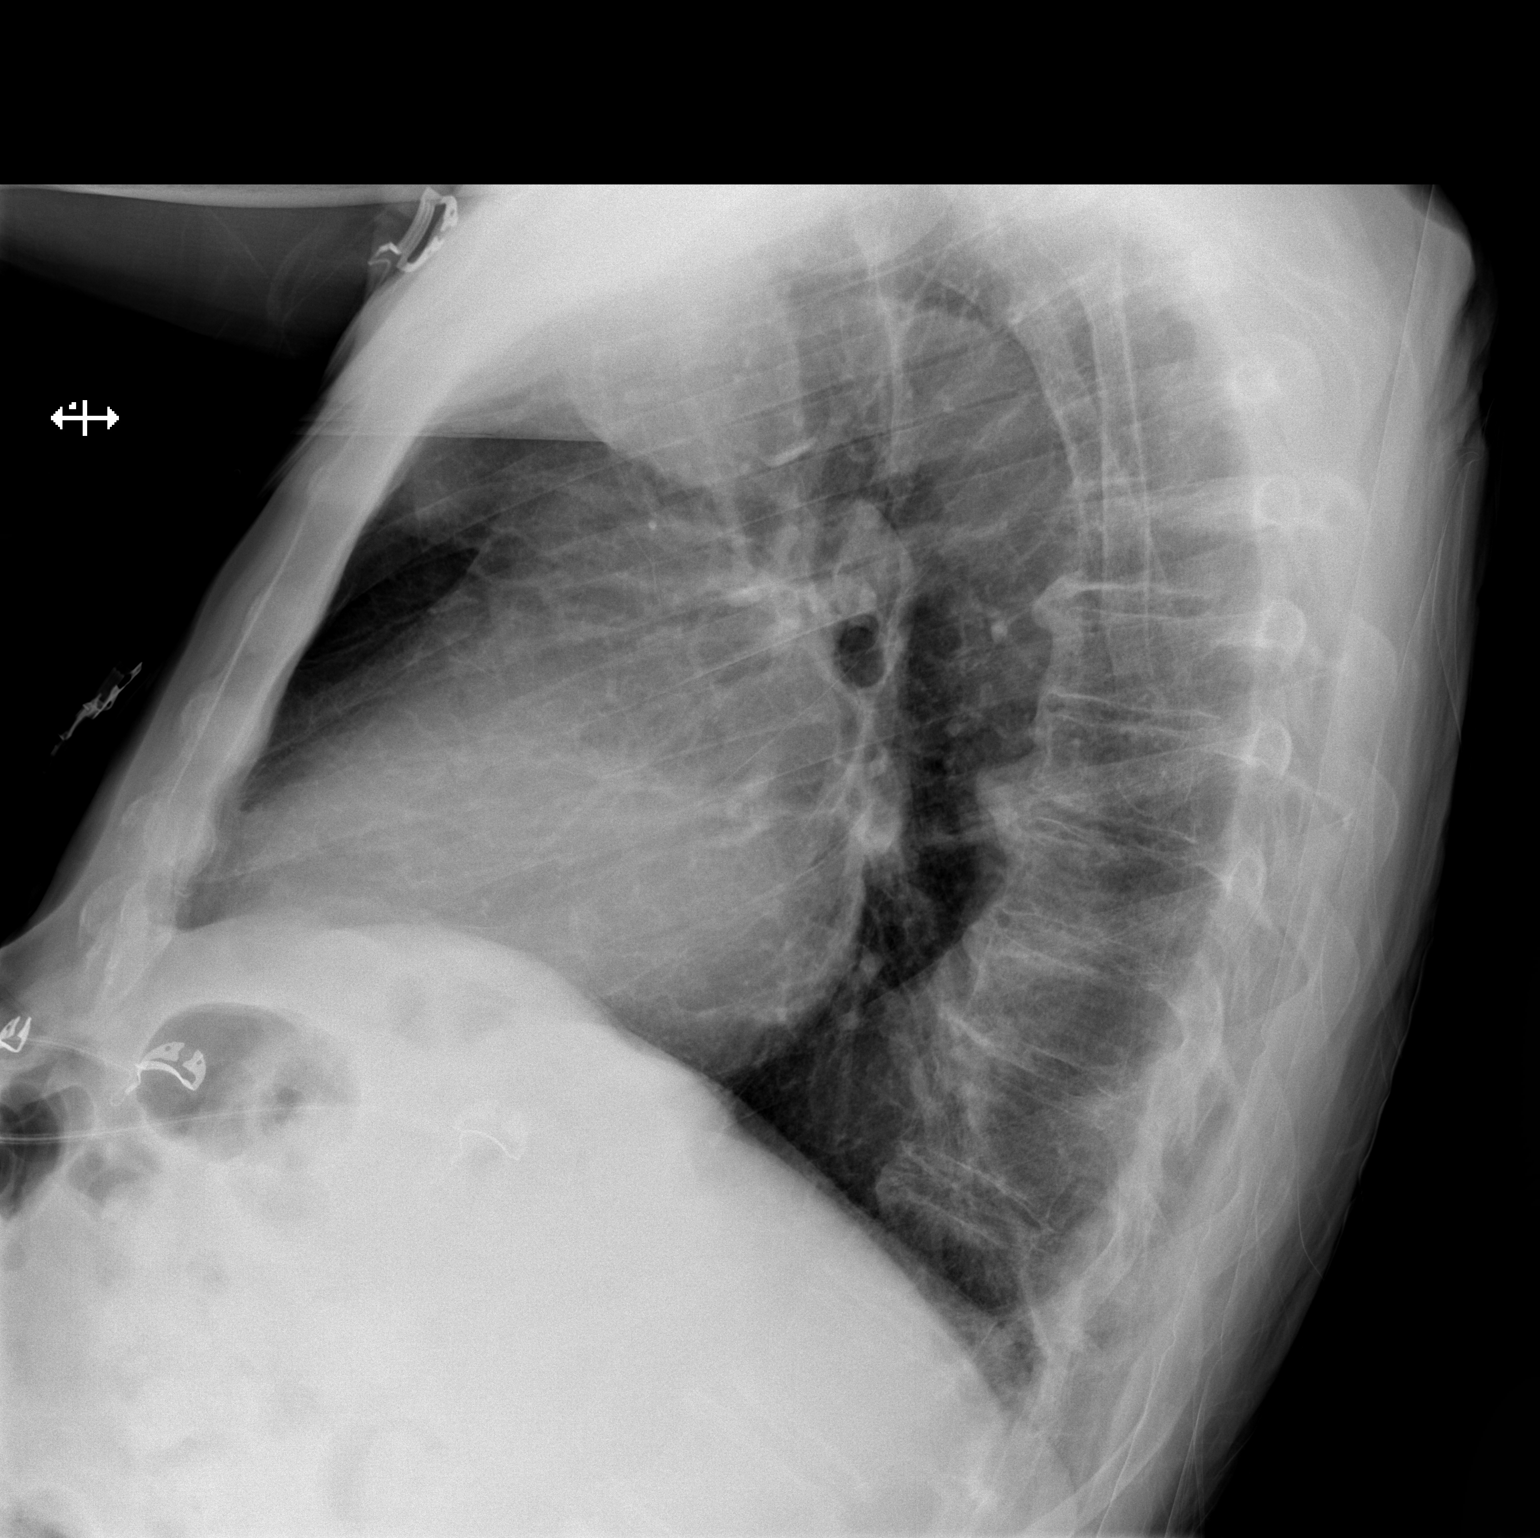

[x chest ap]
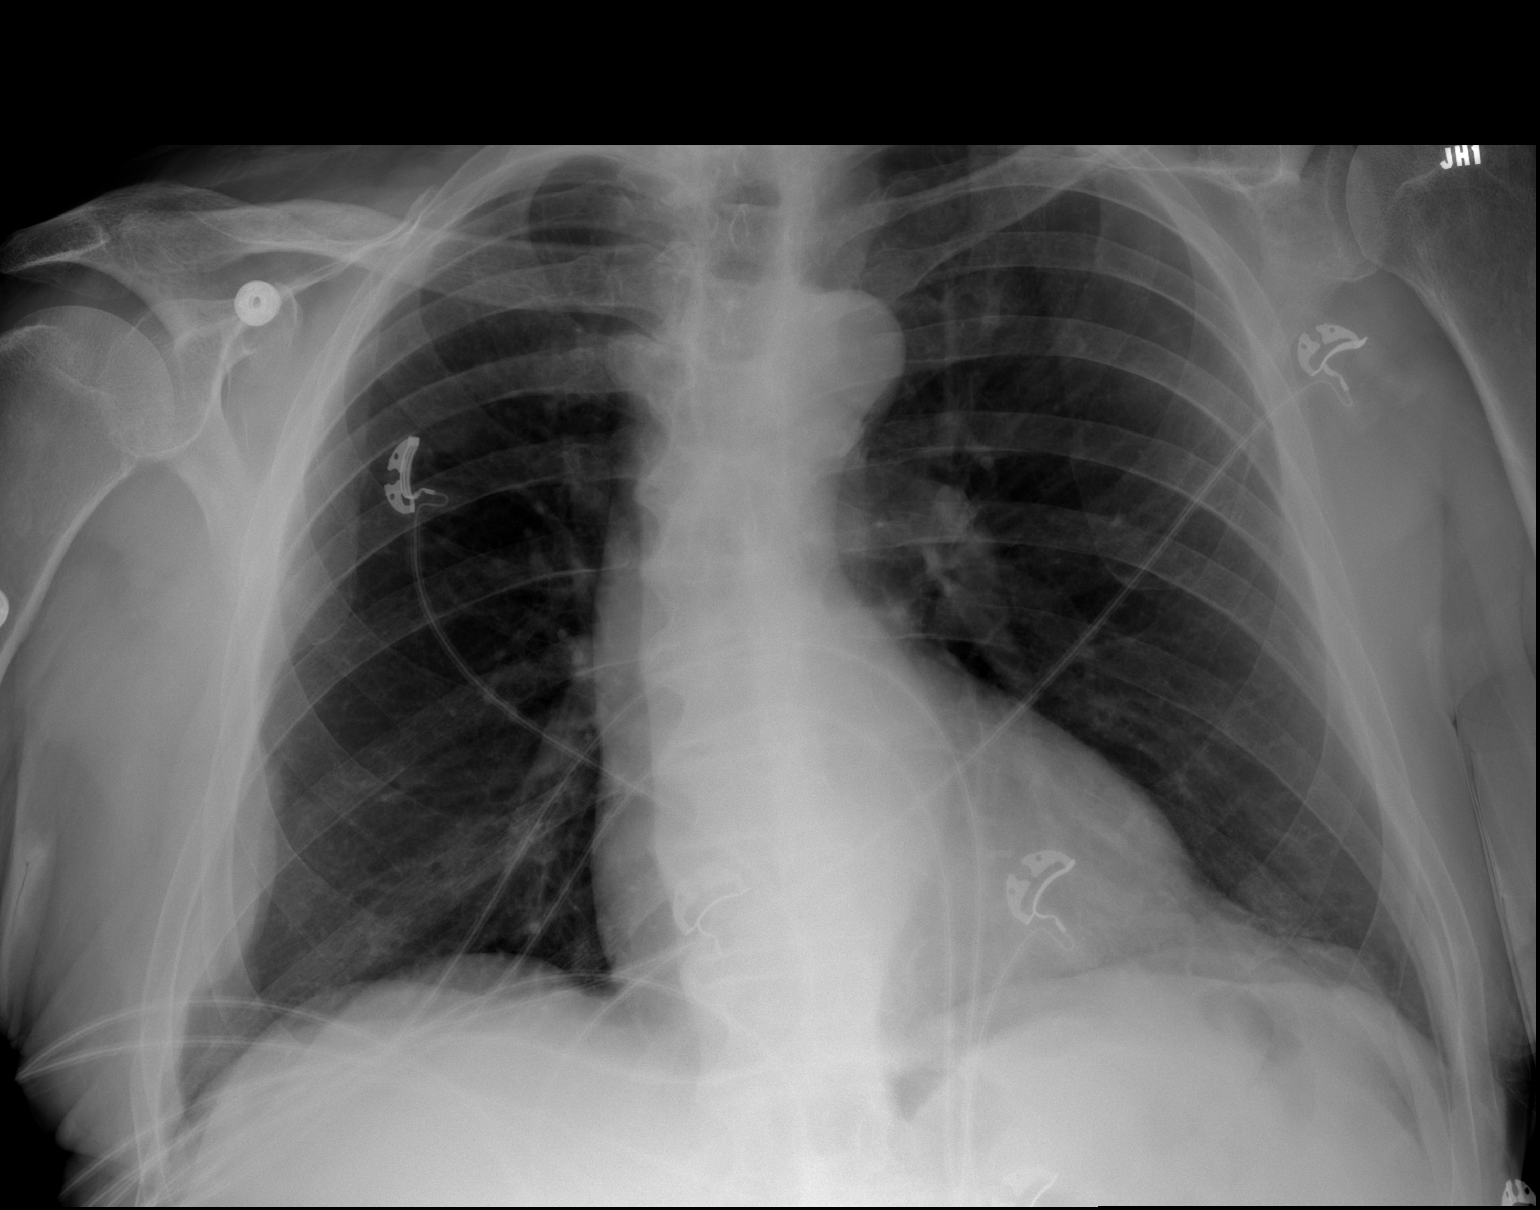

[2 of 2 positions shown; findings below may reference images not displayed]

FINDINGS: Normal heart size, mediastinal contours, and pulmonary vascularity.

Mild tortuosity of thoracic aorta.

Lungs clear.

No pleural effusion or pneumothorax.

Pleural based density at the lateral lower RIGHT chest of uncertain
etiology, approximately 6.7 x 1.8 cm without definite associated
bone destruction ; this corresponds to a sub pleural lipoma on the
prior CT abdomen exam.
IMPRESSION: No acute pulmonary abnormalities.

## 2018-10-09 IMAGING — CT CT ANGIO CHEST
2 of 6 series · 19 of 46 positions shown · IV contrast (ISOVUE)
Comparison: CT abdomen/pelvis 11/05/2016

CLINICAL DATA: Weight loss and fatigue with dizziness and
difficulty standing along. Intermittent nausea.

EXAM:
CT ANGIOGRAPHY CHEST WITH CONTRAST
TECHNIQUE: Multidetector CT imaging of the chest was performed using the
standard protocol during bolus administration of intravenous
contrast. Multiplanar CT image reconstructions and MIPs were
obtained to evaluate the vascular anatomy.
CONTRAST:  90 mL Isovue 370 IV.

[Series 5: thins · axial · 0.81mm/px · z∈[+682,+936]mm · 16 of 280 slices shown]
[im 13/280  lung]
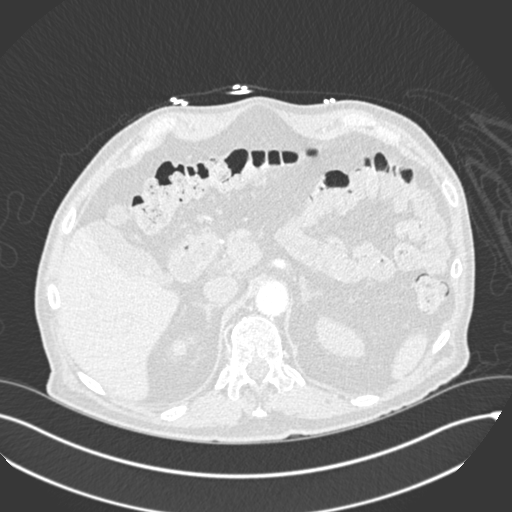
[im 37/280  soft-tissue]
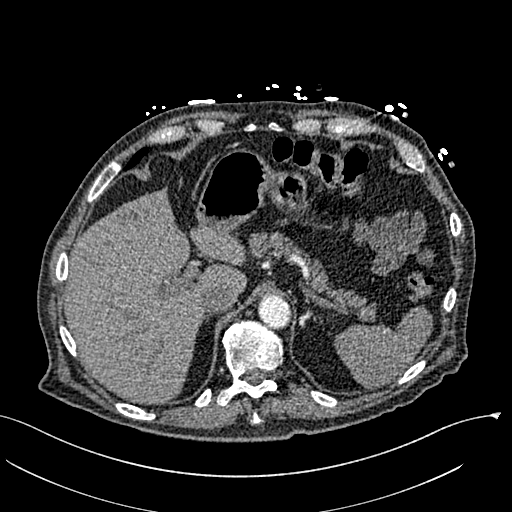
[im 49/280  lung]
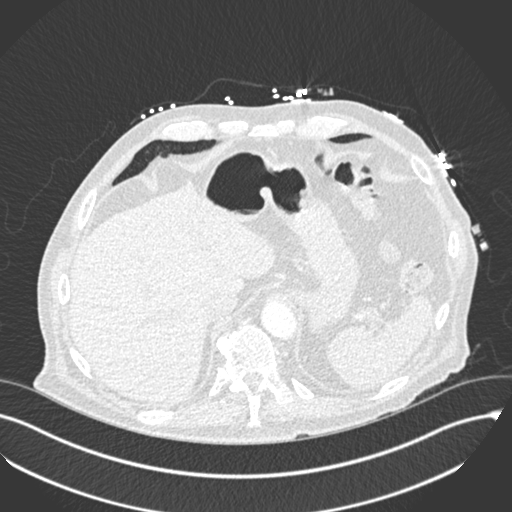
[im 61/280  soft-tissue]
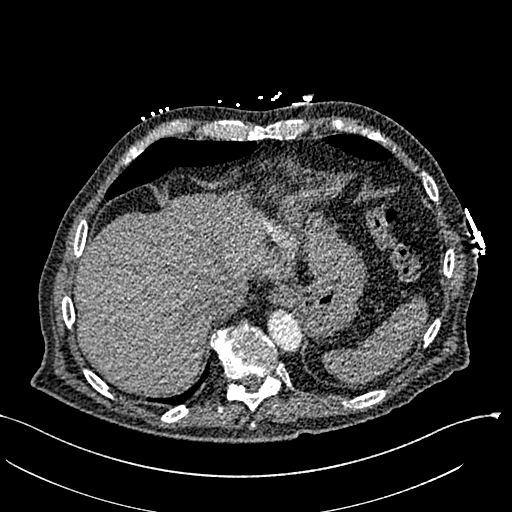
[im 85/280  lung]
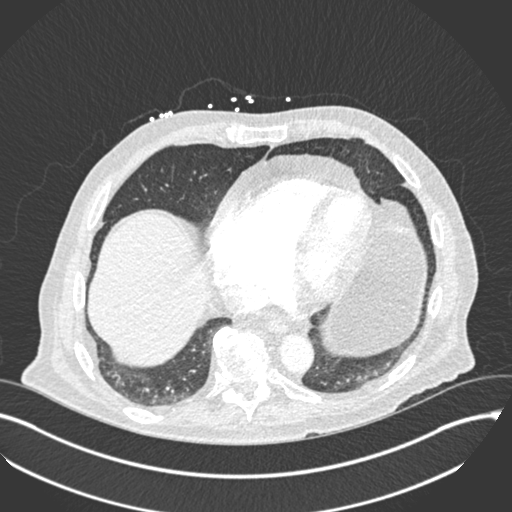
[im 98/280  soft-tissue]
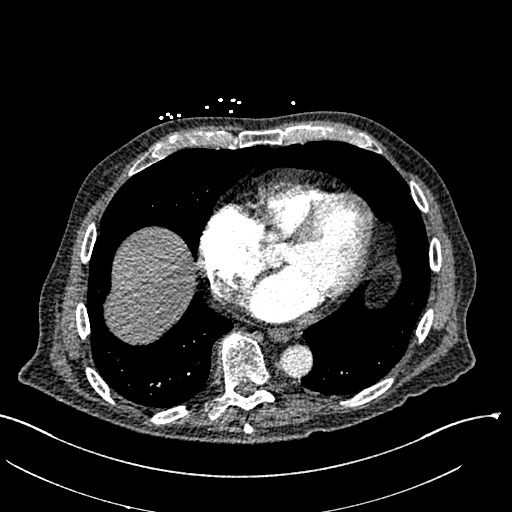
[im 110/280  lung]
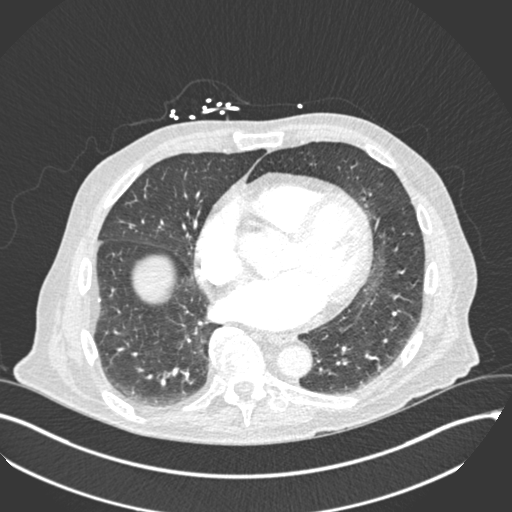
[im 134/280  soft-tissue]
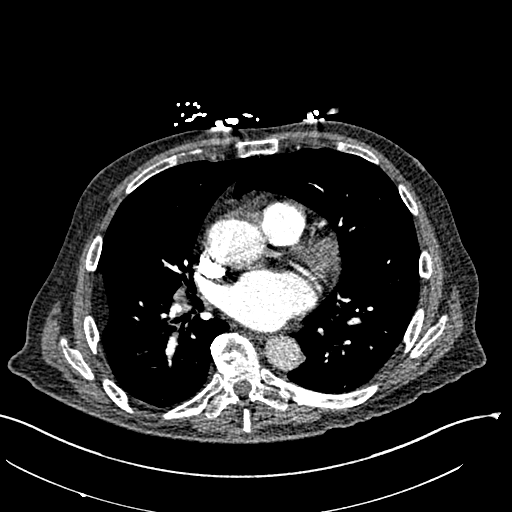
[im 146/280  lung]
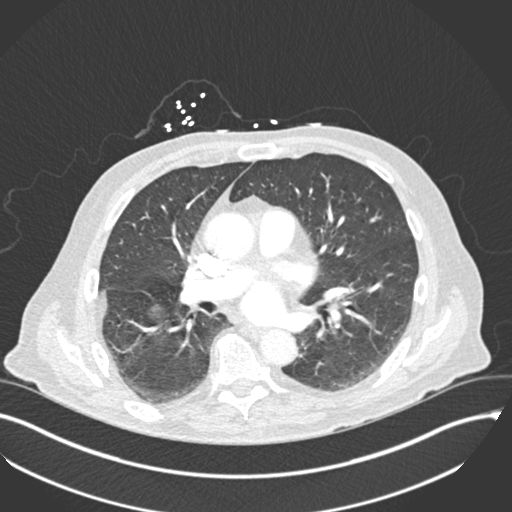
[im 170/280  soft-tissue]
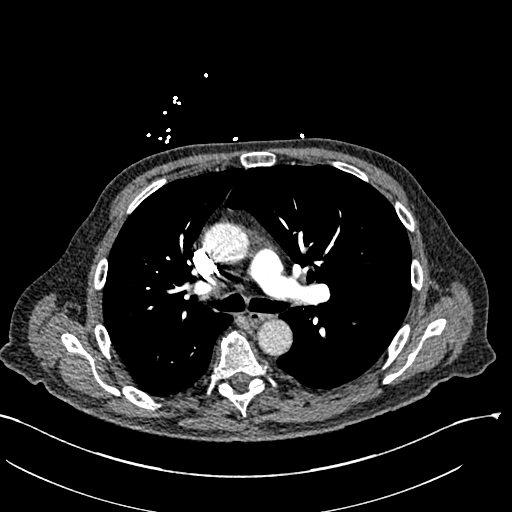
[im 182/280  lung]
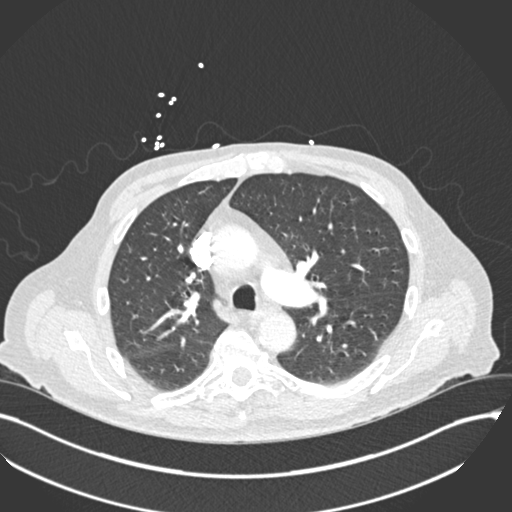
[im 195/280  soft-tissue]
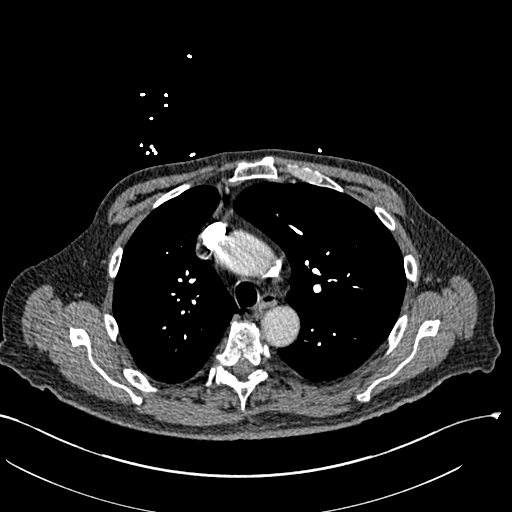
[im 219/280  lung]
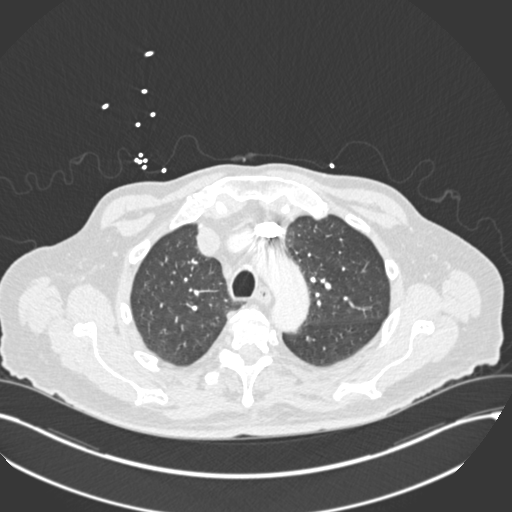
[im 231/280  soft-tissue]
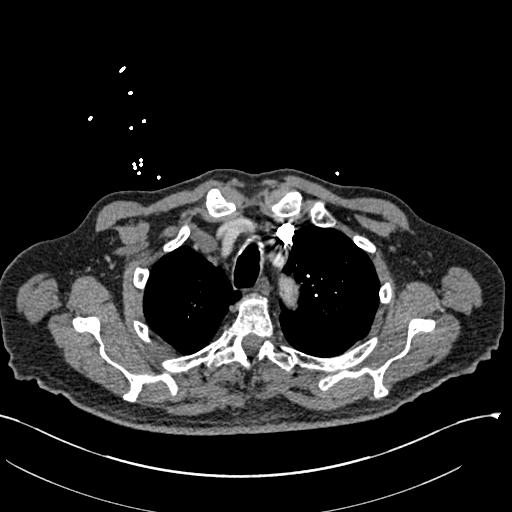
[im 243/280  lung]
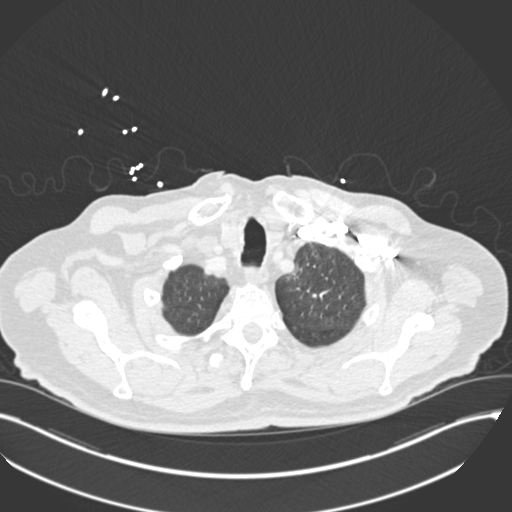
[im 267/280  soft-tissue]
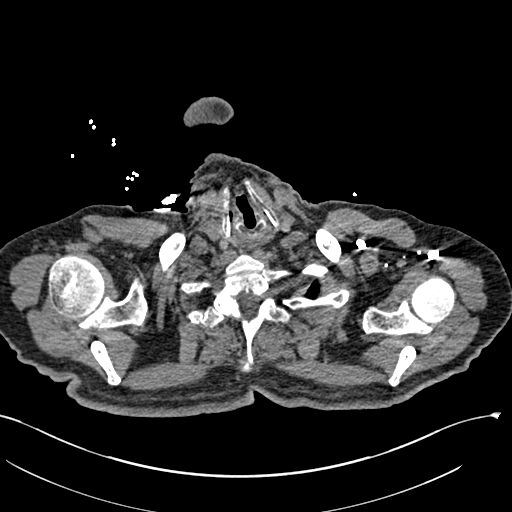

[Series 7: coronal mpr · coronal · 0.57mm/px · 3 of 143 slices shown]
[im 36/143  soft-tissue]
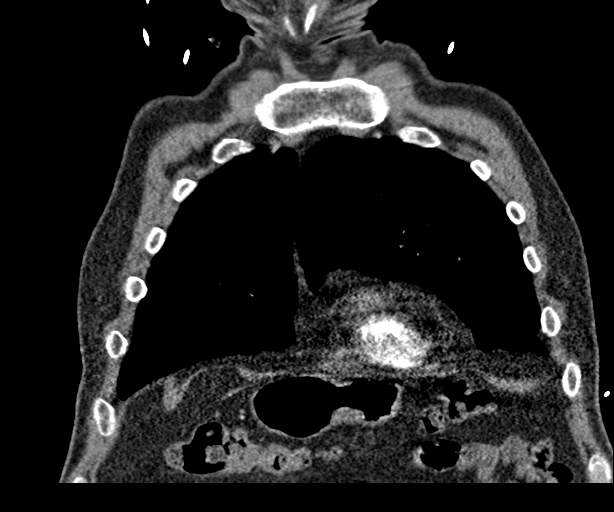
[im 72/143  soft-tissue]
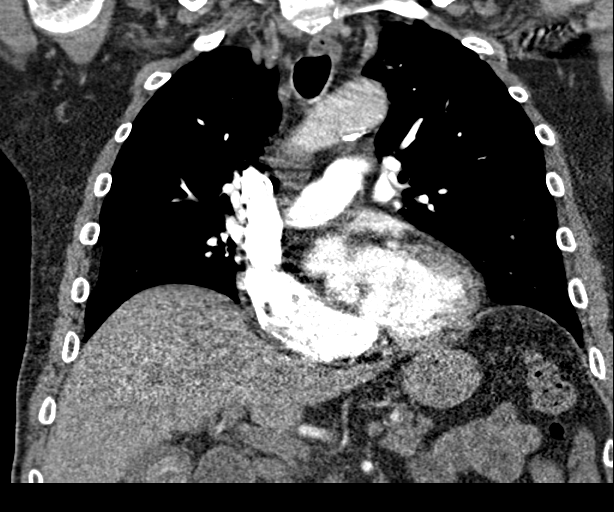
[im 107/143  soft-tissue]
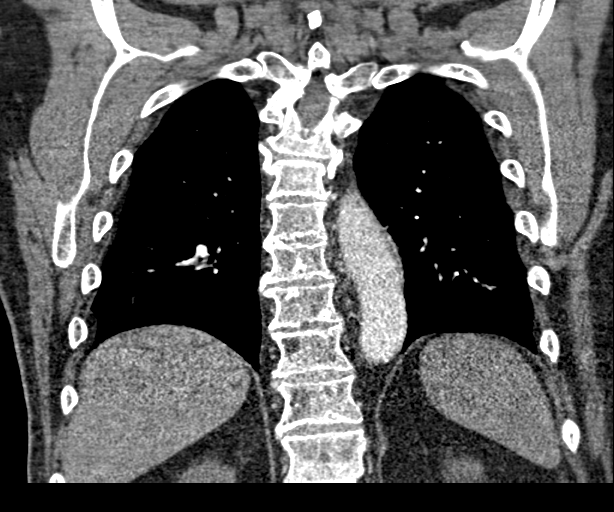

[19 of 46 positions shown; findings below may reference images not displayed]

FINDINGS: Cardiovascular: Heart is normal size. Mild calcified plaque over the
thoracic aorta. No evidence of pulmonary embolism.

Mediastinum/Nodes: No hilar or mediastinal adenopathy. Remaining
mediastinal structures are unremarkable.

Lungs/Pleura: Lungs are well inflated without focal consolidation or
effusion. There is focal fatty pleural thickening along the wall
lateral right lower thorax unchanged. Airways are normal.

Upper Abdomen: Couple liver cysts unchanged with the larger
measuring 3.2 cm. 2.2 cm gallstone.

Musculoskeletal: Degenerative change of the spine.

Review of the MIP images confirms the above findings.
IMPRESSION: No evidence of pulmonary embolism. No acute cardiopulmonary disease.

2.2 cm gallstone.

Two liver cysts unchanged.

Aortic Atherosclerosis ([id number]).

## 2019-04-05 DIAGNOSIS — I482 Chronic atrial fibrillation, unspecified: Secondary | ICD-10-CM | POA: Diagnosis not present

## 2019-09-29 DIAGNOSIS — Z79899 Other long term (current) drug therapy: Secondary | ICD-10-CM | POA: Diagnosis not present

## 2019-09-29 DIAGNOSIS — I482 Chronic atrial fibrillation, unspecified: Secondary | ICD-10-CM | POA: Diagnosis not present

## 2019-10-06 DIAGNOSIS — I482 Chronic atrial fibrillation, unspecified: Secondary | ICD-10-CM | POA: Diagnosis not present

## 2019-10-06 DIAGNOSIS — Z862 Personal history of diseases of the blood and blood-forming organs and certain disorders involving the immune mechanism: Secondary | ICD-10-CM | POA: Diagnosis not present

## 2019-11-10 DIAGNOSIS — Z23 Encounter for immunization: Secondary | ICD-10-CM | POA: Diagnosis not present

## 2020-04-13 DIAGNOSIS — I482 Chronic atrial fibrillation, unspecified: Secondary | ICD-10-CM | POA: Diagnosis not present

## 2020-10-06 DIAGNOSIS — Z79899 Other long term (current) drug therapy: Secondary | ICD-10-CM | POA: Diagnosis not present

## 2020-10-06 DIAGNOSIS — I482 Chronic atrial fibrillation, unspecified: Secondary | ICD-10-CM | POA: Diagnosis not present

## 2020-10-06 DIAGNOSIS — D649 Anemia, unspecified: Secondary | ICD-10-CM | POA: Diagnosis not present

## 2020-10-13 DIAGNOSIS — I482 Chronic atrial fibrillation, unspecified: Secondary | ICD-10-CM | POA: Diagnosis not present

## 2020-10-13 DIAGNOSIS — Z23 Encounter for immunization: Secondary | ICD-10-CM | POA: Diagnosis not present

## 2020-10-13 DIAGNOSIS — D6869 Other thrombophilia: Secondary | ICD-10-CM | POA: Diagnosis not present

## 2021-01-19 DIAGNOSIS — I482 Chronic atrial fibrillation, unspecified: Secondary | ICD-10-CM | POA: Diagnosis not present

## 2021-01-19 DIAGNOSIS — N200 Calculus of kidney: Secondary | ICD-10-CM | POA: Diagnosis not present

## 2021-04-12 DIAGNOSIS — D6869 Other thrombophilia: Secondary | ICD-10-CM | POA: Diagnosis not present

## 2021-04-12 DIAGNOSIS — I482 Chronic atrial fibrillation, unspecified: Secondary | ICD-10-CM | POA: Diagnosis not present

## 2021-04-12 DIAGNOSIS — Z23 Encounter for immunization: Secondary | ICD-10-CM | POA: Diagnosis not present

## 2021-10-05 DIAGNOSIS — I4821 Permanent atrial fibrillation: Secondary | ICD-10-CM | POA: Diagnosis not present

## 2021-10-05 DIAGNOSIS — D6869 Other thrombophilia: Secondary | ICD-10-CM | POA: Diagnosis not present

## 2021-10-05 DIAGNOSIS — Z79899 Other long term (current) drug therapy: Secondary | ICD-10-CM | POA: Diagnosis not present

## 2021-10-12 DIAGNOSIS — I10 Essential (primary) hypertension: Secondary | ICD-10-CM | POA: Diagnosis not present

## 2021-10-12 DIAGNOSIS — Z23 Encounter for immunization: Secondary | ICD-10-CM | POA: Diagnosis not present

## 2021-10-12 DIAGNOSIS — I482 Chronic atrial fibrillation, unspecified: Secondary | ICD-10-CM | POA: Diagnosis not present

## 2022-04-12 DIAGNOSIS — I482 Chronic atrial fibrillation, unspecified: Secondary | ICD-10-CM | POA: Diagnosis not present

## 2022-10-07 DIAGNOSIS — Z79899 Other long term (current) drug therapy: Secondary | ICD-10-CM | POA: Diagnosis not present

## 2022-10-07 DIAGNOSIS — I4821 Permanent atrial fibrillation: Secondary | ICD-10-CM | POA: Diagnosis not present

## 2022-10-07 DIAGNOSIS — D509 Iron deficiency anemia, unspecified: Secondary | ICD-10-CM | POA: Diagnosis not present

## 2022-10-14 DIAGNOSIS — I482 Chronic atrial fibrillation, unspecified: Secondary | ICD-10-CM | POA: Diagnosis not present

## 2022-10-14 DIAGNOSIS — D509 Iron deficiency anemia, unspecified: Secondary | ICD-10-CM | POA: Diagnosis not present

## 2022-10-14 DIAGNOSIS — Z23 Encounter for immunization: Secondary | ICD-10-CM | POA: Diagnosis not present

## 2023-04-14 DIAGNOSIS — I482 Chronic atrial fibrillation, unspecified: Secondary | ICD-10-CM | POA: Diagnosis not present

## 2023-04-14 DIAGNOSIS — D509 Iron deficiency anemia, unspecified: Secondary | ICD-10-CM | POA: Diagnosis not present

## 2023-10-09 DIAGNOSIS — I4821 Permanent atrial fibrillation: Secondary | ICD-10-CM | POA: Diagnosis not present

## 2023-10-09 DIAGNOSIS — D509 Iron deficiency anemia, unspecified: Secondary | ICD-10-CM | POA: Diagnosis not present

## 2023-10-16 DIAGNOSIS — I482 Chronic atrial fibrillation, unspecified: Secondary | ICD-10-CM | POA: Diagnosis not present

## 2023-10-16 DIAGNOSIS — D509 Iron deficiency anemia, unspecified: Secondary | ICD-10-CM | POA: Diagnosis not present
